# Patient Record
Sex: Female | Born: 1945 | Race: White | Hispanic: No | Smoking: Former smoker
Health system: Southern US, Community
[De-identification: ages and names within clinical notes are randomized; demographics above are authoritative.]

## PROBLEM LIST (undated history)

## (undated) DIAGNOSIS — I809 Phlebitis and thrombophlebitis of unspecified site: Secondary | ICD-10-CM

## (undated) DIAGNOSIS — I10 Essential (primary) hypertension: Secondary | ICD-10-CM

## (undated) DIAGNOSIS — M5432 Sciatica, left side: Secondary | ICD-10-CM

## (undated) DIAGNOSIS — C8590 Non-Hodgkin lymphoma, unspecified, unspecified site: Secondary | ICD-10-CM

## (undated) DIAGNOSIS — T8859XA Other complications of anesthesia, initial encounter: Secondary | ICD-10-CM

## (undated) DIAGNOSIS — M8580 Other specified disorders of bone density and structure, unspecified site: Secondary | ICD-10-CM

## (undated) DIAGNOSIS — Z8589 Personal history of malignant neoplasm of other organs and systems: Secondary | ICD-10-CM

## (undated) DIAGNOSIS — N183 Chronic kidney disease, stage 3 unspecified: Secondary | ICD-10-CM

## (undated) DIAGNOSIS — N6009 Solitary cyst of unspecified breast: Secondary | ICD-10-CM

## (undated) DIAGNOSIS — Z8672 Personal history of thrombophlebitis: Secondary | ICD-10-CM

## (undated) DIAGNOSIS — H269 Unspecified cataract: Secondary | ICD-10-CM

## (undated) DIAGNOSIS — E785 Hyperlipidemia, unspecified: Secondary | ICD-10-CM

## (undated) DIAGNOSIS — R011 Cardiac murmur, unspecified: Secondary | ICD-10-CM

## (undated) DIAGNOSIS — J45909 Unspecified asthma, uncomplicated: Secondary | ICD-10-CM

## (undated) DIAGNOSIS — Z85828 Personal history of other malignant neoplasm of skin: Secondary | ICD-10-CM

## (undated) HISTORY — DX: Personal history of other malignant neoplasm of skin: Z85.828

## (undated) HISTORY — DX: Personal history of malignant neoplasm of other organs and systems: Z85.89

## (undated) HISTORY — DX: Sciatica, left side: M54.32

## (undated) HISTORY — DX: Phlebitis and thrombophlebitis of unspecified site: I80.9

## (undated) HISTORY — DX: Solitary cyst of unspecified breast: N60.09

## (undated) HISTORY — DX: Chronic kidney disease, stage 3 unspecified: N18.30

## (undated) HISTORY — DX: Essential (primary) hypertension: I10

## (undated) HISTORY — PX: BREAST CYST EXCISION: SHX579

## (undated) HISTORY — DX: Personal history of thrombophlebitis: Z86.72

## (undated) HISTORY — DX: Other specified disorders of bone density and structure, unspecified site: M85.80

## (undated) HISTORY — DX: Unspecified cataract: H26.9

## (undated) HISTORY — PX: OTHER SURGICAL HISTORY: SHX169

## (undated) HISTORY — DX: Cardiac murmur, unspecified: R01.1

## (undated) HISTORY — DX: Hyperlipidemia, unspecified: E78.5

## (undated) HISTORY — DX: Non-Hodgkin lymphoma, unspecified, unspecified site: C85.90

---

## 1987-07-05 DIAGNOSIS — Z8572 Personal history of non-Hodgkin lymphomas: Secondary | ICD-10-CM | POA: Insufficient documentation

## 1987-07-05 DIAGNOSIS — C8590 Non-Hodgkin lymphoma, unspecified, unspecified site: Secondary | ICD-10-CM

## 1987-07-05 HISTORY — DX: Non-Hodgkin lymphoma, unspecified, unspecified site: C85.90

## 1989-07-04 HISTORY — PX: BONE MARROW TRANSPLANT: SHX200

## 1999-01-13 ENCOUNTER — Encounter: Payer: Self-pay | Admitting: *Deleted

## 1999-01-13 ENCOUNTER — Ambulatory Visit (HOSPITAL_COMMUNITY): Admission: RE | Admit: 1999-01-13 | Discharge: 1999-01-13 | Payer: Self-pay | Admitting: *Deleted

## 2000-01-13 ENCOUNTER — Ambulatory Visit (HOSPITAL_COMMUNITY): Admission: RE | Admit: 2000-01-13 | Discharge: 2000-01-13 | Payer: Self-pay | Admitting: *Deleted

## 2000-01-13 ENCOUNTER — Encounter: Payer: Self-pay | Admitting: *Deleted

## 2001-05-22 ENCOUNTER — Ambulatory Visit (HOSPITAL_COMMUNITY): Admission: RE | Admit: 2001-05-22 | Discharge: 2001-05-22 | Payer: Self-pay | Admitting: Family Medicine

## 2001-05-22 ENCOUNTER — Encounter: Payer: Self-pay | Admitting: Family Medicine

## 2002-06-11 ENCOUNTER — Ambulatory Visit (HOSPITAL_COMMUNITY): Admission: RE | Admit: 2002-06-11 | Discharge: 2002-06-11 | Payer: Self-pay | Admitting: Family Medicine

## 2002-06-11 ENCOUNTER — Encounter: Payer: Self-pay | Admitting: Family Medicine

## 2003-06-16 ENCOUNTER — Other Ambulatory Visit: Admission: RE | Admit: 2003-06-16 | Discharge: 2003-06-16 | Payer: Self-pay | Admitting: Family Medicine

## 2004-06-29 ENCOUNTER — Other Ambulatory Visit: Admission: RE | Admit: 2004-06-29 | Discharge: 2004-06-29 | Payer: Self-pay | Admitting: Family Medicine

## 2005-06-30 ENCOUNTER — Other Ambulatory Visit: Admission: RE | Admit: 2005-06-30 | Discharge: 2005-06-30 | Payer: Self-pay | Admitting: Family Medicine

## 2006-09-20 DIAGNOSIS — E785 Hyperlipidemia, unspecified: Secondary | ICD-10-CM | POA: Insufficient documentation

## 2007-10-26 ENCOUNTER — Other Ambulatory Visit: Admission: RE | Admit: 2007-10-26 | Discharge: 2007-10-26 | Payer: Self-pay | Admitting: Family Medicine

## 2008-10-16 ENCOUNTER — Encounter: Admission: RE | Admit: 2008-10-16 | Discharge: 2008-10-16 | Payer: Self-pay | Admitting: Family Medicine

## 2009-05-19 ENCOUNTER — Encounter (INDEPENDENT_AMBULATORY_CARE_PROVIDER_SITE_OTHER): Payer: Self-pay | Admitting: *Deleted

## 2010-01-08 ENCOUNTER — Other Ambulatory Visit: Admission: RE | Admit: 2010-01-08 | Discharge: 2010-01-08 | Payer: Self-pay | Admitting: Family Medicine

## 2010-01-14 ENCOUNTER — Telehealth: Payer: Self-pay | Admitting: Gastroenterology

## 2010-06-17 ENCOUNTER — Encounter
Admission: RE | Admit: 2010-06-17 | Discharge: 2010-06-17 | Payer: Self-pay | Source: Home / Self Care | Attending: Family Medicine | Admitting: Family Medicine

## 2010-08-03 NOTE — Progress Notes (Signed)
Summary: Schedule Colonoscopy  Phone Note Outgoing Call Call back at [facility] Phone [phone]   Call placed by: Harlow Mares CMA Duncan Dull),  January 14, 2010 3:46 PM Call placed to: Patient Summary of Call: patient is due for a colonoscopy, I called the patients number and there is no way to leave a message but i will try to call the patient back again.  Initial call taken by: Harlow Mares CMA [place] Dull),  January 14, 2010 3:49 PM  Follow-up for Phone Call        patients number will not allow [place] to leave a message she is due for a colonoscopy. We will mail her a letter to remind her. Follow-up by: Harlow Mares CMA Duncan Dull),  January 22, 2010 3:58 PM

## 2011-01-20 IMAGING — CT CT ABD-PELV W/O CM
3 of 4 series · 13 of 36 positions shown, 19 images · non-contrast
Comparison: None.

CLINICAL DATA: Recent left flank pain which has resolved

CT ABDOMEN AND PELVIS WITHOUT CONTRAST
TECHNIQUE: Multidetector CT imaging of the abdomen and pelvis was
performed following the standard protocol without intravenous
contrast.

[Series 3: renal stone · axial · 0.73mm/px · z∈[-402,-87]mm · 8 of 82 slices shown, 13 images]
[im 10/82  soft-tissue]
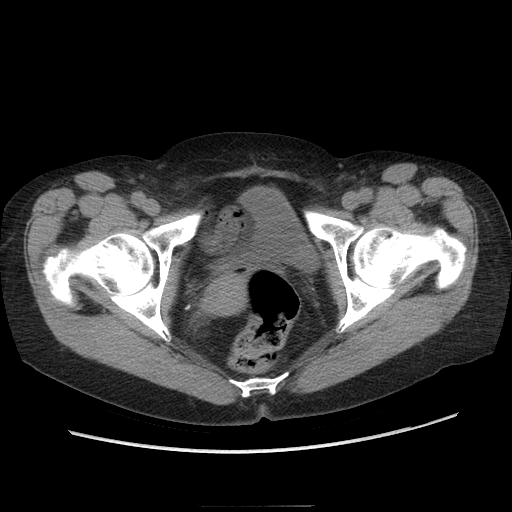
[im 10/82  bone]
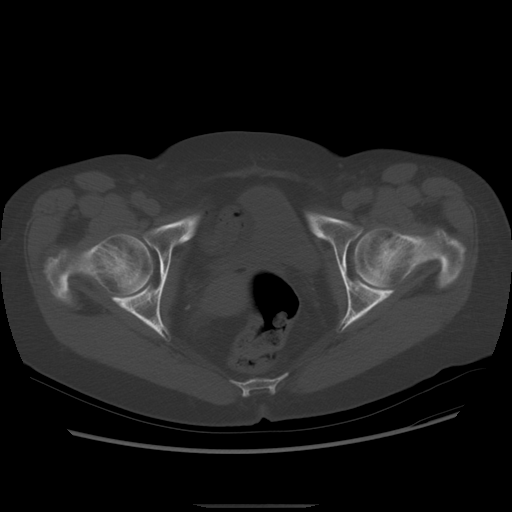
[im 19/82  soft-tissue]
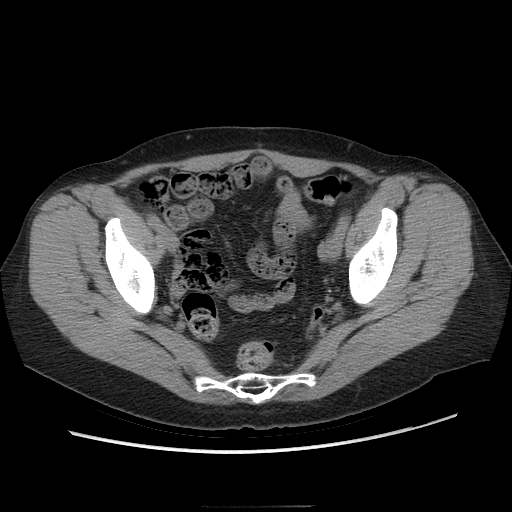
[im 28/82  soft-tissue]
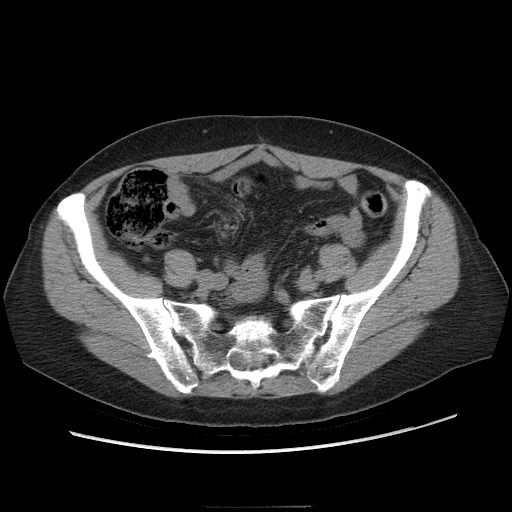
[im 37/82  soft-tissue]
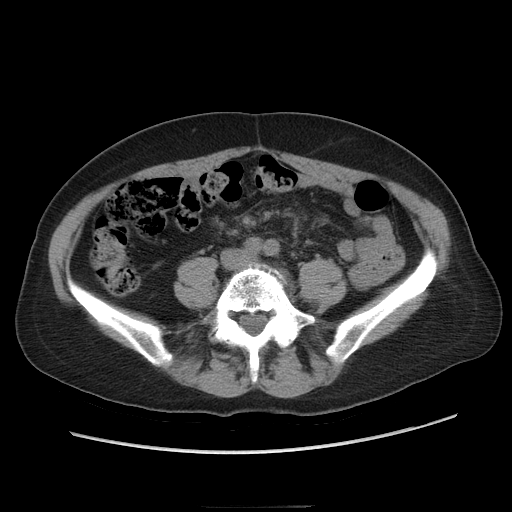
[im 46/82  soft-tissue]
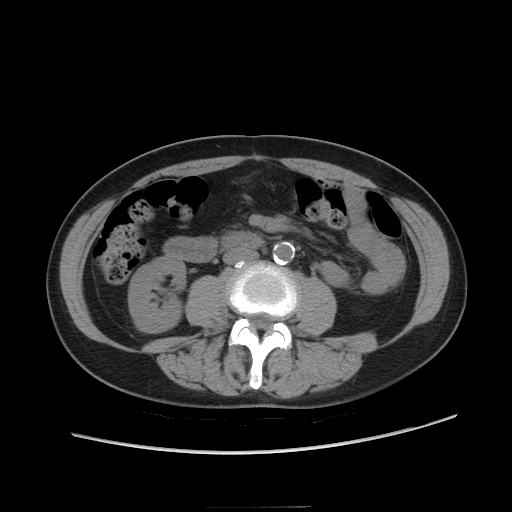
[im 46/82  lung]
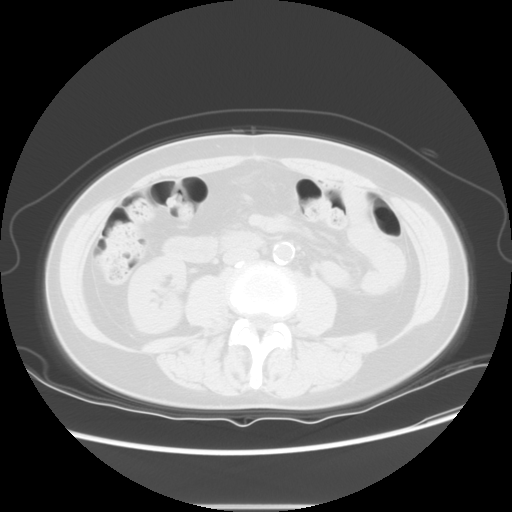
[im 55/82  soft-tissue]
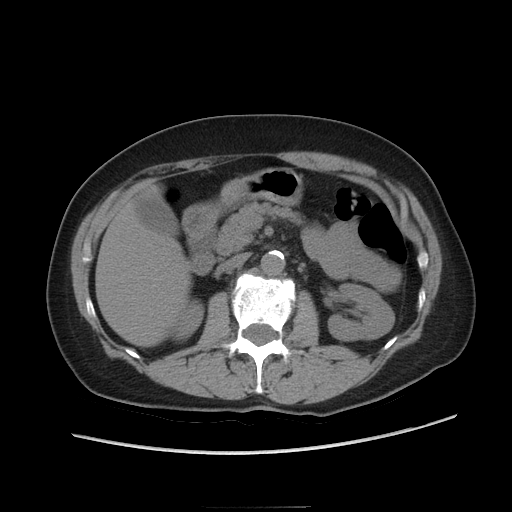
[im 55/82  lung]
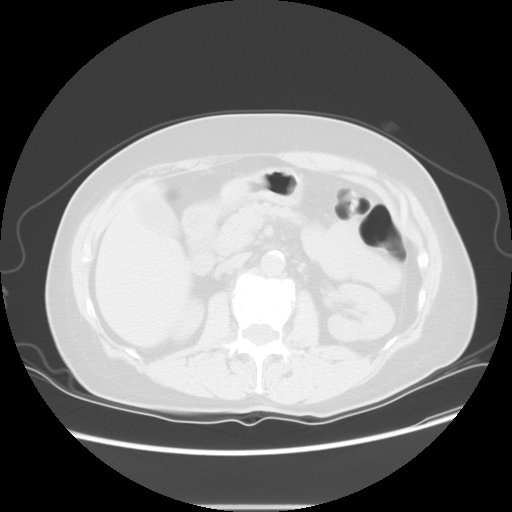
[im 64/82  soft-tissue]
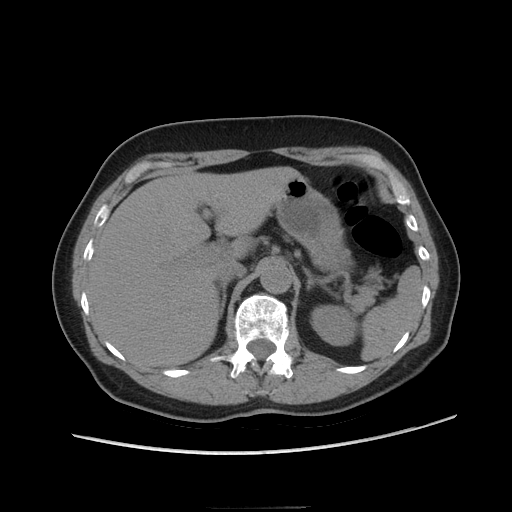
[im 64/82  lung]
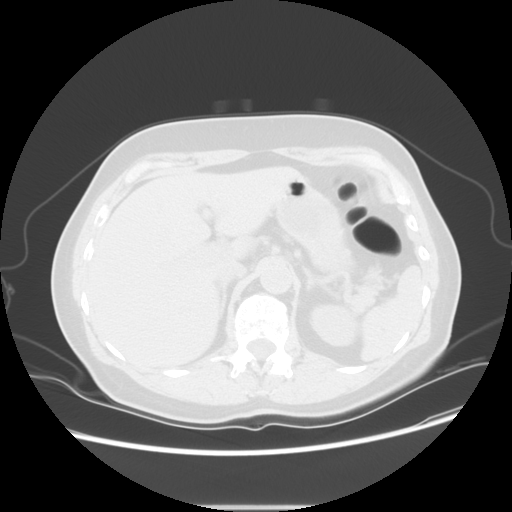
[im 73/82  soft-tissue]
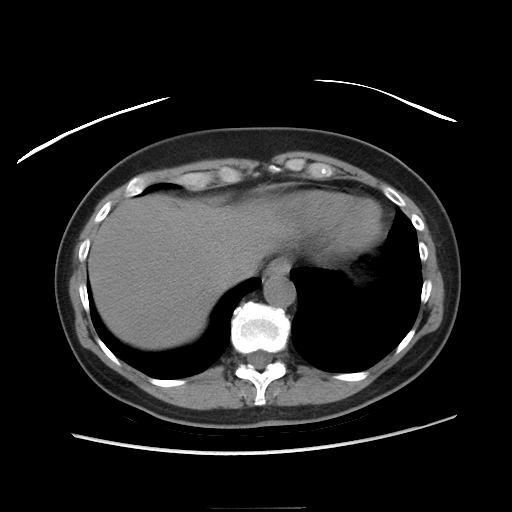
[im 73/82  lung]
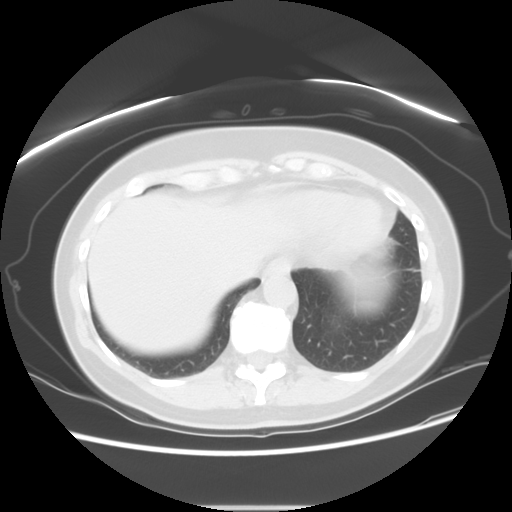

[Series 601: coronal body · coronal · 0.85mm/px · 1 of 97 slices shown, 2 images]
[im 33/97  soft-tissue]
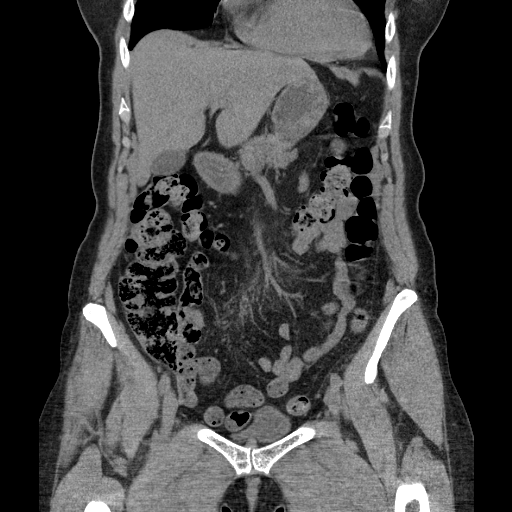
[im 33/97  bone]
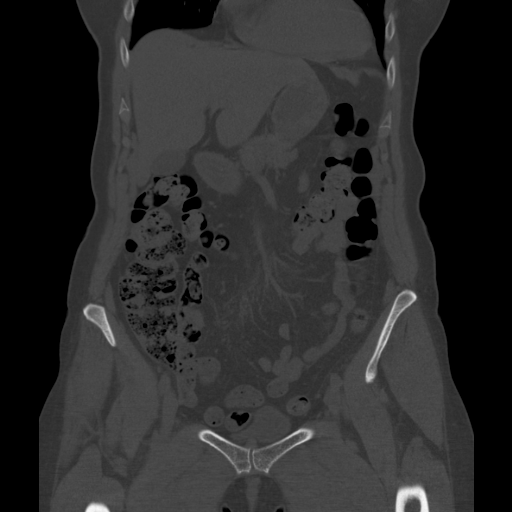

[Series 602: sagittal body · sagittal · 0.85mm/px · 4 of 151 slices shown]
[im 17/151  soft-tissue]
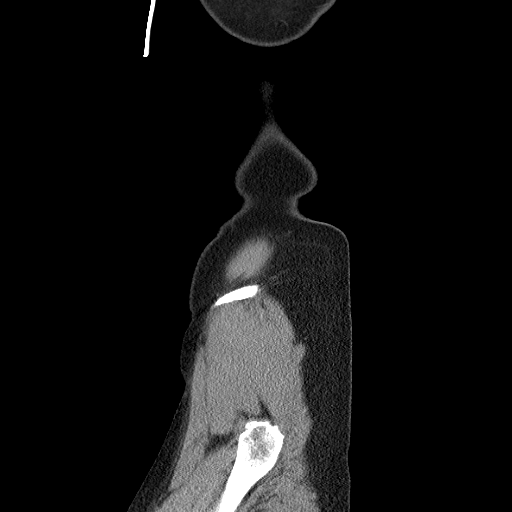
[im 34/151  soft-tissue]
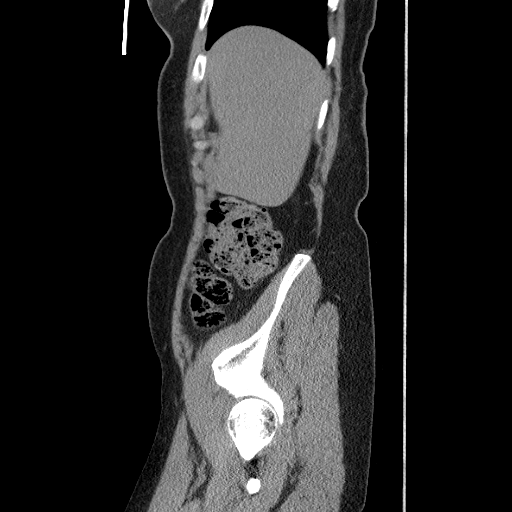
[im 51/151  soft-tissue]
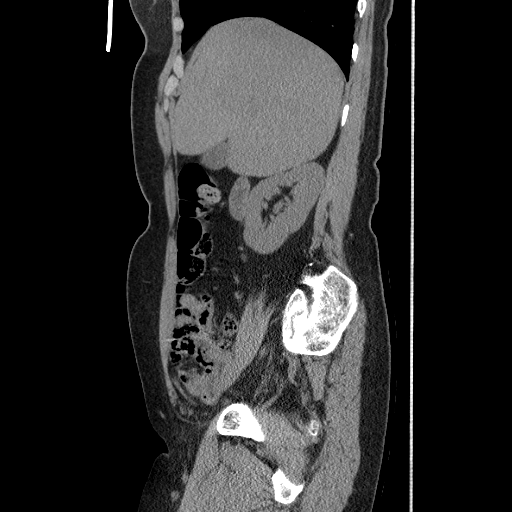
[im 67/151  soft-tissue]
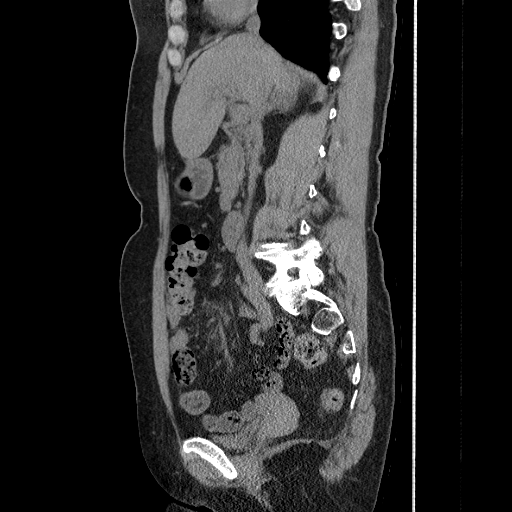

[13 of 36 positions shown; findings below may reference images not displayed]

FINDINGS: The lung bases are clear.  The liver is unremarkable in
the unenhanced state.  No calcified gallstones are seen.  The
pancreas is normal in size and the pancreatic duct is not dilated.
The adrenal glands and spleen are unremarkable.  There are single
small nonobstructing lower pole renal calculi bilaterally of no
more than 2-3 mm in diameter.  The proximal ureters are normal in
caliber.  The abdominal aorta appears normal.

There is some strandiness of the mesenteric fat.  This probably is
due to prior chemotherapy and radiation treatment for non-Hodgkin's
lymphoma years ago.  No adenopathy is currently seen.  The distal
ureters appear normal in caliber and no distal ureteral calculi are
noted.  The urinary bladder is not well distended but is
unremarkable.  The uterus is normal in size.  No adnexal lesion is
seen.  No fluid is noted within the pelvis.  The terminal ileum and
the appendix are unremarkable.  There is degenerative disc disease
at L4-5 and L5 the S1 levels.
IMPRESSION: 1.  Small nonobstructing renal calculi.  No present hydronephrosis.
2.  No ureteral calculi.  The urinary bladder is not well distended
but is unremarkable.
3.  Some strandiness of the soft tissues of the mesentery may be
due to prior radiation and chemotherapy for non-Hodgkin's lymphoma.
No present adenopathy is seen.

## 2011-11-17 ENCOUNTER — Encounter: Payer: Self-pay | Admitting: Gastroenterology

## 2014-01-31 LAB — VITAMIN B12: VITAMIN B 12: 388

## 2015-01-29 DIAGNOSIS — Z1231 Encounter for screening mammogram for malignant neoplasm of breast: Secondary | ICD-10-CM | POA: Diagnosis not present

## 2015-02-09 DIAGNOSIS — Z0000 Encounter for general adult medical examination without abnormal findings: Secondary | ICD-10-CM | POA: Diagnosis not present

## 2015-02-09 DIAGNOSIS — E780 Pure hypercholesterolemia: Secondary | ICD-10-CM | POA: Diagnosis not present

## 2015-02-09 DIAGNOSIS — Z8572 Personal history of non-Hodgkin lymphomas: Secondary | ICD-10-CM | POA: Diagnosis not present

## 2015-02-09 DIAGNOSIS — N183 Chronic kidney disease, stage 3 (moderate): Secondary | ICD-10-CM | POA: Diagnosis not present

## 2015-02-12 DIAGNOSIS — L821 Other seborrheic keratosis: Secondary | ICD-10-CM | POA: Diagnosis not present

## 2015-02-12 DIAGNOSIS — C44722 Squamous cell carcinoma of skin of right lower limb, including hip: Secondary | ICD-10-CM | POA: Diagnosis not present

## 2015-02-12 DIAGNOSIS — C44612 Basal cell carcinoma of skin of right upper limb, including shoulder: Secondary | ICD-10-CM | POA: Diagnosis not present

## 2015-02-12 DIAGNOSIS — Z85828 Personal history of other malignant neoplasm of skin: Secondary | ICD-10-CM | POA: Diagnosis not present

## 2015-02-12 DIAGNOSIS — D485 Neoplasm of uncertain behavior of skin: Secondary | ICD-10-CM | POA: Diagnosis not present

## 2015-02-12 DIAGNOSIS — L720 Epidermal cyst: Secondary | ICD-10-CM | POA: Diagnosis not present

## 2015-02-12 DIAGNOSIS — D239 Other benign neoplasm of skin, unspecified: Secondary | ICD-10-CM | POA: Diagnosis not present

## 2015-02-12 DIAGNOSIS — C44519 Basal cell carcinoma of skin of other part of trunk: Secondary | ICD-10-CM | POA: Diagnosis not present

## 2015-03-19 DIAGNOSIS — C44722 Squamous cell carcinoma of skin of right lower limb, including hip: Secondary | ICD-10-CM | POA: Diagnosis not present

## 2015-03-19 DIAGNOSIS — L905 Scar conditions and fibrosis of skin: Secondary | ICD-10-CM | POA: Diagnosis not present

## 2015-04-02 DIAGNOSIS — C44612 Basal cell carcinoma of skin of right upper limb, including shoulder: Secondary | ICD-10-CM | POA: Diagnosis not present

## 2015-04-02 DIAGNOSIS — C44519 Basal cell carcinoma of skin of other part of trunk: Secondary | ICD-10-CM | POA: Diagnosis not present

## 2015-04-17 DIAGNOSIS — H2513 Age-related nuclear cataract, bilateral: Secondary | ICD-10-CM | POA: Diagnosis not present

## 2015-05-14 DIAGNOSIS — N183 Chronic kidney disease, stage 3 (moderate): Secondary | ICD-10-CM | POA: Diagnosis not present

## 2016-01-20 DIAGNOSIS — Z1231 Encounter for screening mammogram for malignant neoplasm of breast: Secondary | ICD-10-CM | POA: Diagnosis not present

## 2016-01-20 LAB — HM MAMMOGRAPHY

## 2016-02-18 DIAGNOSIS — Z5181 Encounter for therapeutic drug level monitoring: Secondary | ICD-10-CM | POA: Diagnosis not present

## 2016-02-18 DIAGNOSIS — Z0000 Encounter for general adult medical examination without abnormal findings: Secondary | ICD-10-CM | POA: Diagnosis not present

## 2016-02-18 DIAGNOSIS — Z8572 Personal history of non-Hodgkin lymphomas: Secondary | ICD-10-CM | POA: Diagnosis not present

## 2016-02-18 DIAGNOSIS — N183 Chronic kidney disease, stage 3 (moderate): Secondary | ICD-10-CM | POA: Diagnosis not present

## 2016-02-18 DIAGNOSIS — M5430 Sciatica, unspecified side: Secondary | ICD-10-CM | POA: Diagnosis not present

## 2016-02-18 DIAGNOSIS — E7800 Pure hypercholesterolemia, unspecified: Secondary | ICD-10-CM | POA: Diagnosis not present

## 2016-02-18 LAB — BASIC METABOLIC PANEL
BUN: 23 mg/dL — AB (ref 4–21)
CREATININE: 1 mg/dL (ref <=1.1)
GLUCOSE: 101 mg/dL
POTASSIUM: 4.2 mmol/L (ref 3.4–5.3)
Sodium: 141 mmol/L (ref 137–147)

## 2016-02-18 LAB — LIPID PANEL
CHOLESTEROL: 168 mg/dL (ref 0–200)
HDL: 61 mg/dL (ref 35–70)
LDL Cholesterol: 87 mg/dL
Triglycerides: 99 mg/dL (ref 40–160)

## 2016-02-18 LAB — HEPATIC FUNCTION PANEL
ALT: 31 U/L (ref 7–35)
AST: 26 U/L (ref 13–35)

## 2016-02-25 DIAGNOSIS — D485 Neoplasm of uncertain behavior of skin: Secondary | ICD-10-CM | POA: Diagnosis not present

## 2016-02-25 DIAGNOSIS — Z85828 Personal history of other malignant neoplasm of skin: Secondary | ICD-10-CM | POA: Diagnosis not present

## 2016-02-25 DIAGNOSIS — D225 Melanocytic nevi of trunk: Secondary | ICD-10-CM | POA: Diagnosis not present

## 2016-02-25 DIAGNOSIS — L821 Other seborrheic keratosis: Secondary | ICD-10-CM | POA: Diagnosis not present

## 2016-02-26 DIAGNOSIS — C44519 Basal cell carcinoma of skin of other part of trunk: Secondary | ICD-10-CM | POA: Diagnosis not present

## 2016-03-31 DIAGNOSIS — C44519 Basal cell carcinoma of skin of other part of trunk: Secondary | ICD-10-CM | POA: Diagnosis not present

## 2016-03-31 DIAGNOSIS — Z23 Encounter for immunization: Secondary | ICD-10-CM | POA: Diagnosis not present

## 2016-05-09 DIAGNOSIS — D485 Neoplasm of uncertain behavior of skin: Secondary | ICD-10-CM | POA: Diagnosis not present

## 2016-05-11 DIAGNOSIS — C44519 Basal cell carcinoma of skin of other part of trunk: Secondary | ICD-10-CM | POA: Diagnosis not present

## 2016-05-11 DIAGNOSIS — C44719 Basal cell carcinoma of skin of left lower limb, including hip: Secondary | ICD-10-CM | POA: Diagnosis not present

## 2016-06-09 DIAGNOSIS — Z23 Encounter for immunization: Secondary | ICD-10-CM | POA: Diagnosis not present

## 2016-06-09 DIAGNOSIS — C44719 Basal cell carcinoma of skin of left lower limb, including hip: Secondary | ICD-10-CM | POA: Diagnosis not present

## 2016-06-09 DIAGNOSIS — C44519 Basal cell carcinoma of skin of other part of trunk: Secondary | ICD-10-CM | POA: Diagnosis not present

## 2016-09-07 DIAGNOSIS — L821 Other seborrheic keratosis: Secondary | ICD-10-CM | POA: Diagnosis not present

## 2016-09-07 DIAGNOSIS — L918 Other hypertrophic disorders of the skin: Secondary | ICD-10-CM | POA: Diagnosis not present

## 2016-09-07 DIAGNOSIS — L570 Actinic keratosis: Secondary | ICD-10-CM | POA: Diagnosis not present

## 2016-09-16 ENCOUNTER — Telehealth: Payer: Self-pay | Admitting: *Deleted

## 2016-09-16 ENCOUNTER — Encounter: Payer: Self-pay | Admitting: *Deleted

## 2016-09-16 NOTE — Telephone Encounter (Signed)
PreVisit Call Completed. Pt brought in New Patient Packet and will bring in medication list tomorrow with all multivitamins and supplements. Pt states she was a patient of [facility] and is due for her AWV in August.

## 2016-09-19 ENCOUNTER — Ambulatory Visit (INDEPENDENT_AMBULATORY_CARE_PROVIDER_SITE_OTHER): Payer: Medicare Other | Admitting: Family Medicine

## 2016-09-19 ENCOUNTER — Encounter: Payer: Self-pay | Admitting: Family Medicine

## 2016-09-19 VITALS — BP 140/80 | HR 68 | Temp 98.2°F | Ht 65.25 in | Wt 158.0 lb

## 2016-09-19 DIAGNOSIS — M5432 Sciatica, left side: Secondary | ICD-10-CM | POA: Insufficient documentation

## 2016-09-19 DIAGNOSIS — R011 Cardiac murmur, unspecified: Secondary | ICD-10-CM | POA: Insufficient documentation

## 2016-09-19 DIAGNOSIS — Z0000 Encounter for general adult medical examination without abnormal findings: Secondary | ICD-10-CM | POA: Diagnosis not present

## 2016-09-19 DIAGNOSIS — E785 Hyperlipidemia, unspecified: Secondary | ICD-10-CM

## 2016-09-19 MED ORDER — SIMVASTATIN 40 MG PO TABS: ORAL_TABLET | ORAL | 3 refills | Status: DC

## 2016-09-19 NOTE — Progress Notes (Signed)
Pre visit review using our clinic review tool, if applicable. No additional management support is needed unless otherwise documented below in the visit note. 

## 2016-09-19 NOTE — Progress Notes (Signed)
Sierra Summers is a 71 y.o. female is here to [facility].   History of Present Illness:   HPI:   Sierra Summers is a 71 y.o. female who presents for evaluation of dyslipidemia. The patient does not use medications that may worsen dyslipidemias (corticosteroids, progestins, anabolic steroids, diuretics, beta-blockers, amiodarone, cyclosporine, olanzapine). Exercise: daily - yoga, pickle ball. Previous history of cardiac disease includes: None. Cardiovascular ROS: no chest pain or dyspnea on exertion.  Health Maintenance Due  Topic Date Due  . Hepatitis C Screening  Mar 24, 1946  . TETANUS/TDAP  08/24/1964  . MAMMOGRAM  08/25/1995  . DEXA SCAN  08/24/2010  . PNA vac Low Risk Adult (1 of 2 - PCV13) 08/24/2010  . COLONOSCOPY  06/21/2012  . INFLUENZA VACCINE  02/02/2016   Will need to request records from [facility].  PMHx, SurgHx, SocialHx, Medications, and Allergies were reviewed in the Visit Navigator and updated as appropriate.   Past Medical History:  Diagnosis Date  . Breast cyst   . Cataract   . Heart murmur   . Hyperlipidemia   . Non Hodgkin's lymphoma ([place]) 1989    Past Surgical History:  Procedure Laterality Date  . BREAST CYST EXCISION Right   . Laprascopy  1989 and 1992    Family History  Problem Relation Age of Onset  . Heart disease Mother   . Hyperlipidemia Mother   . Hypertension Mother   . Stroke Mother   . Diabetes Father   . Heart disease Father   . Hyperlipidemia Father   . Mental illness Father   . Heart disease Sister   . Hyperlipidemia Sister   . Hypertension Sister   . Stroke Sister   . Hyperlipidemia Sister   . Hypertension Sister   . Mental illness Sister   . Cancer Maternal Grandmother   . Heart disease Maternal Grandfather   . Mental illness Paternal Grandmother   . Mental illness Paternal Grandfather     Social History  Substance Use Topics  . Smoking status: Former Smoker    Quit date: 07/05/1983  . Smokeless  tobacco: Never Used  . Alcohol use Yes     Comment: 4-5 drinks/week    Current Medications and Allergies:    Current Outpatient Prescriptions:  .  aspirin EC 81 MG tablet, Take 81 mg by mouth daily., Disp: , Rfl:  .  Coenzyme Q10 (CO Q 10) 100 MG CAPS, Take 1 capsule by mouth daily., Disp: , Rfl:  .  Multiple Vitamin (MULTIVITAMIN) tablet, Take 1 tablet by mouth daily., Disp: , Rfl:  .  Omega-3 Fatty Acids (OMEGA 3 PO), Take 1,280 mg by mouth daily., Disp: , Rfl:  .  OVER THE COUNTER MEDICATION, Take 1 capsule by mouth daily. Cordyceps, Disp: , Rfl:  .  OVER THE COUNTER MEDICATION, Take 750 mg by mouth daily. Bacopa, Disp: , Rfl:  .  OVER THE COUNTER MEDICATION, Take 1 capsule by mouth at bedtime as needed. Ashwagandha Root 350 mg, Disp: , Rfl:  .  simvastatin (ZOCOR) 20 MG tablet, Take 20 mg by mouth daily., Disp: , Rfl:   Allergies  Allergen Reactions  . Sulfa Antibiotics Rash     Review of Systems:   Review of Systems  Constitutional: Negative for chills, fever, malaise/fatigue and weight loss.  HENT: Negative for hearing loss.   Eyes: Negative for blurred vision.  Respiratory: Negative for cough and wheezing.   Cardiovascular: Negative for chest pain, palpitations and leg swelling.  Gastrointestinal:  Negative for abdominal pain, constipation, diarrhea, heartburn, nausea and vomiting.  Genitourinary: Negative for dysuria.  Musculoskeletal: Negative for falls and myalgias.  Skin: Negative for rash.  Neurological: Negative for dizziness, weakness and headaches.  Endo/Heme/Allergies: Negative for polydipsia.  Psychiatric/Behavioral: Negative for depression. The patient is not nervous/anxious.     Vitals:   Vitals:   09/19/16 0807  BP: 140/80  Pulse: 68  Temp: 98.2 F (36.8 C)  TempSrc: Oral  SpO2: 97%  Weight: 158 lb (71.7 kg)  Height: 5' 5.25" (1.657 m)     Body mass index is 26.09 kg/m.   Physical Exam:   Physical Exam  Constitutional: She is oriented to  person, place, and time. She appears well-developed and well-nourished. No distress.  HENT:  Head: Normocephalic and atraumatic.  Eyes: Conjunctivae and EOM are normal. Pupils are equal, round, and reactive to light.  Neck: Neck supple. No JVD present. No thyromegaly present.  Cardiovascular: Normal rate, regular rhythm, normal heart sounds and intact distal pulses.   Pulmonary/Chest: Effort normal and breath sounds normal.  Abdominal: Soft. Bowel sounds are normal.  Musculoskeletal: Normal range of motion.  Neurological: She is alert and oriented to person, place, and time.  Skin: Skin is warm and dry. Capillary refill takes less than 2 seconds.  Psychiatric: She has a normal mood and affect. Her behavior is normal. Judgment and thought content normal.     Assessment and Plan:   Sierra Summers was seen today for establish care and hyperlipidemia.  Diagnoses and all orders for this visit:  Healthcare maintenance Comments: Interested in DNR discussion. Will set up with Georga Hacking, RN.  Hyperlipidemia, unspecified hyperlipidemia type Comments: Refill today. Recheck lipid at CPE in July. Orders: -     simvastatin (ZOCOR) 40 MG tablet; 1/2 po q hs   . Reviewed expectations re: course of current medical issues. . Discussed self-management of symptoms. . Outlined signs and symptoms indicating need for more acute intervention. . Patient verbalized understanding and all questions were answered. . See orders for this visit as documented in the electronic medical record. . Patient received an After Visit Summary.  Records requested if needed. I spent 30 minutes with this patient, greater than 50% was face-to-face time counseling regarding the above diagnoses.  Briscoe Deutscher, [place], [facility] 09/19/2016   Follow-up: July for CPE  Meds ordered this encounter  Medications  . aspirin EC 81 MG tablet    Sig: Take 81 mg by mouth daily.  . Multiple Vitamin  (MULTIVITAMIN) tablet    Sig: Take 1 tablet by mouth daily.  . Coenzyme Q10 (CO Q 10) 100 MG CAPS    Sig: Take 1 capsule by mouth daily.  . Omega-3 Fatty Acids (OMEGA 3 PO)    Sig: Take 1,280 mg by mouth daily.  Marland Kitchen OVER THE COUNTER MEDICATION    Sig: Take 1 capsule by mouth daily. Cordyceps  . OVER THE COUNTER MEDICATION    Sig: Take 750 mg by mouth daily. Bacopa  . OVER THE COUNTER MEDICATION    Sig: Take 1 capsule by mouth at bedtime as needed. Ashwagandha Root 350 mg   There are no discontinued medications. No orders of the defined types were placed in this encounter.

## 2016-09-22 ENCOUNTER — Encounter: Payer: Self-pay | Admitting: *Deleted

## 2016-09-22 NOTE — Telephone Encounter (Signed)
This encounter was created in error - please disregard.

## 2016-09-28 ENCOUNTER — Encounter: Payer: Self-pay | Admitting: Family Medicine

## 2016-09-28 DIAGNOSIS — Z8589 Personal history of malignant neoplasm of other organs and systems: Secondary | ICD-10-CM | POA: Insufficient documentation

## 2016-09-28 DIAGNOSIS — Z8672 Personal history of thrombophlebitis: Secondary | ICD-10-CM | POA: Insufficient documentation

## 2016-09-28 DIAGNOSIS — Z85828 Personal history of other malignant neoplasm of skin: Secondary | ICD-10-CM

## 2016-09-28 DIAGNOSIS — Z9481 Bone marrow transplant status: Secondary | ICD-10-CM | POA: Insufficient documentation

## 2016-09-28 DIAGNOSIS — N183 Chronic kidney disease, stage 3 unspecified: Secondary | ICD-10-CM | POA: Insufficient documentation

## 2016-09-28 HISTORY — DX: Personal history of other malignant neoplasm of skin: Z85.828

## 2016-09-28 HISTORY — DX: Personal history of thrombophlebitis: Z86.72

## 2016-09-28 HISTORY — DX: Personal history of malignant neoplasm of other organs and systems: Z85.89

## 2016-09-29 ENCOUNTER — Ambulatory Visit (INDEPENDENT_AMBULATORY_CARE_PROVIDER_SITE_OTHER): Payer: Medicare Other | Admitting: *Deleted

## 2016-09-29 DIAGNOSIS — Z66 Do not resuscitate: Secondary | ICD-10-CM | POA: Diagnosis not present

## 2016-09-29 NOTE — Progress Notes (Signed)
Spent 28 minutes discussing the following issues regarding Advance Directives:   -Goals of care  -Code status: difference between a full code, limited code (MOST form), and DNR -Difference between hospice and palliative care -Living Will  -Reason and significance for power of attorney -Reviewed each page in Advance Directive packet and answered any questions by patient  Patient was alert and oriented x4 and appeared to have no cognitive impairments during visit.   Patient requested code status change to DNR.  Provider present for discussion of status change and ensured patient understanding of Do Not Resuscitate orders. Provider signed DNR form and DNR form was scanned into chart.   Patient left visit with Advance Directive packet and plans to return completed on Monday. Patient left visit with original DNR form in hand.

## 2016-09-29 NOTE — Progress Notes (Signed)
RN advanced directive note reviewed. I was present for DNR discussion. Paperwork completed.   Briscoe Deutscher, D.O. [place], [facility]

## 2016-10-04 NOTE — Progress Notes (Signed)
Patient returned to office with completed Healthcare Power of Attorney and Living Will. Reviewed documents, scanned documents into patients chart and gave originals back to patient.

## 2016-11-07 ENCOUNTER — Telehealth: Payer: Self-pay | Admitting: Family Medicine

## 2016-11-07 NOTE — Telephone Encounter (Signed)
Rec'd from [place] forward 17 pages to Dr. Briscoe Deutscher

## 2016-12-12 DIAGNOSIS — H2513 Age-related nuclear cataract, bilateral: Secondary | ICD-10-CM | POA: Diagnosis not present

## 2016-12-21 ENCOUNTER — Telehealth: Payer: Self-pay | Admitting: Family Medicine

## 2016-12-21 NOTE — Telephone Encounter (Signed)
Left pt message asking to call Ebony Hail back directly at [phone] to schedule AWV. Thanks!  *NOTE* Please schedule after 02/01/17

## 2017-01-31 ENCOUNTER — Telehealth: Payer: Self-pay | Admitting: Family Medicine

## 2017-01-31 NOTE — Telephone Encounter (Signed)
Patient has requested a DEXA via mychart. Please advise.

## 2017-02-01 ENCOUNTER — Other Ambulatory Visit: Payer: Self-pay

## 2017-02-01 DIAGNOSIS — E2839 Other primary ovarian failure: Secondary | ICD-10-CM

## 2017-02-01 NOTE — Telephone Encounter (Signed)
Ok to order 

## 2017-02-01 NOTE — Telephone Encounter (Signed)
Absolutely.

## 2017-02-01 NOTE — Telephone Encounter (Signed)
Order placed.  Patient will be contacted to schedule appointment.

## 2017-02-10 NOTE — Telephone Encounter (Signed)
Scheduled 03/20/17

## 2017-02-15 DIAGNOSIS — Z1231 Encounter for screening mammogram for malignant neoplasm of breast: Secondary | ICD-10-CM | POA: Diagnosis not present

## 2017-02-15 LAB — HM MAMMOGRAPHY

## 2017-02-28 DIAGNOSIS — Z85828 Personal history of other malignant neoplasm of skin: Secondary | ICD-10-CM | POA: Diagnosis not present

## 2017-02-28 DIAGNOSIS — L821 Other seborrheic keratosis: Secondary | ICD-10-CM | POA: Diagnosis not present

## 2017-02-28 DIAGNOSIS — D225 Melanocytic nevi of trunk: Secondary | ICD-10-CM | POA: Diagnosis not present

## 2017-02-28 DIAGNOSIS — L570 Actinic keratosis: Secondary | ICD-10-CM | POA: Diagnosis not present

## 2017-02-28 DIAGNOSIS — L720 Epidermal cyst: Secondary | ICD-10-CM | POA: Diagnosis not present

## 2017-03-10 ENCOUNTER — Ambulatory Visit
Admission: RE | Admit: 2017-03-10 | Discharge: 2017-03-10 | Disposition: A | Discharge status: Home / Self Care | Payer: Medicare Other | Source: Ambulatory Visit | Attending: Family Medicine | Admitting: Family Medicine

## 2017-03-10 DIAGNOSIS — E2839 Other primary ovarian failure: Secondary | ICD-10-CM

## 2017-03-10 DIAGNOSIS — M8588 Other specified disorders of bone density and structure, other site: Secondary | ICD-10-CM | POA: Diagnosis not present

## 2017-03-10 DIAGNOSIS — Z780 Asymptomatic menopausal state: Secondary | ICD-10-CM | POA: Diagnosis not present

## 2017-03-13 ENCOUNTER — Encounter: Payer: Self-pay | Admitting: Family Medicine

## 2017-03-13 DIAGNOSIS — M8580 Other specified disorders of bone density and structure, unspecified site: Secondary | ICD-10-CM | POA: Insufficient documentation

## 2017-03-16 NOTE — Progress Notes (Signed)
Pre visit review using our clinic review tool, if applicable. No additional management support is needed unless otherwise documented below in the visit note. 

## 2017-03-16 NOTE — Progress Notes (Signed)
PCP notes:   Health maintenance: Colonoscopy - Cologuard ordered today. Flu - given today.  Abnormal screenings: none   Patient concerns: none   Nurse concerns: none   Next PCP appt: 03/26/2018

## 2017-03-16 NOTE — Progress Notes (Signed)
Subjective:   Sierra Summers is a 71 y.o. female who presents for Medicare Annual (Subsequent) preventive examination.  Review of Systems:  No ROS.  Medicare Wellness Visit. Additional risk factors are reflected in the social history.  Cardiac Risk Factors include: advanced age (>35men, >85 women)     Objective:     Vitals: BP (!) 160/72 (BP Location: Right Arm, Patient Position: Sitting, Cuff Size: Normal)   Pulse 77   Resp 16   Wt 157 lb 9.6 oz (71.5 kg)   SpO2 98%   BMI 26.03 kg/m   Body mass index is 26.03 kg/m.   Tobacco History  Smoking Status  . Former Smoker  . Years: 10.00  . Types: Cigarettes  . Quit date: 07/05/1983  Smokeless Tobacco  . Never Used    Comment: Pt states she was a binge smoker     Counseling given: Not Answered   Past Medical History:  Diagnosis Date  . Breast cyst   . Cataract   . Heart murmur   . Hx of phlebitis 09/28/2016  . 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 Hx of squamous cell carcinoma 09/28/2016  . Hyperlipidemia   . Non Hodgkin's lymphoma ([place]) 1989   Past Surgical History:  Procedure Laterality Date  . BONE MARROW TRANSPLANT  1991  . BREAST CYST EXCISION Right   . Laprascopy  1989 and 1992   Family History  Problem Relation Age of Onset  . Heart disease Mother   . Hyperlipidemia Mother   . Hypertension Mother   . Stroke Mother   . Diabetes Father   . Heart disease Father   . Hyperlipidemia Father   . Mental illness Father   . Heart disease Sister   . Hyperlipidemia Sister   . Hypertension Sister   . Stroke Sister   . Hyperlipidemia Sister   . Hypertension Sister   . Mental illness Sister   . Cancer Maternal Grandmother   . Heart disease Maternal Grandfather   . Mental illness Paternal Grandmother   . Mental illness Paternal Grandfather   . Heart attack Sister   . Stroke Sister   . Dementia Sister   . Parkinson's disease Sister    History  Sexual Activity  . Sexual activity: Not  Currently  . Partners: Male    Outpatient Encounter Prescriptions as of 03/20/2017  Medication Sig  . aspirin EC 81 MG tablet Take 81 mg by mouth as needed.   . Coenzyme Q10 (CO Q 10) 100 MG CAPS Take 1 capsule by mouth daily.  Marland Kitchen MELATONIN PO Take 5 mg by mouth at bedtime.  . Multiple Vitamin (MULTIVITAMIN) tablet Take 1 tablet by mouth daily.  . Omega-3 Fatty Acids (OMEGA 3 PO) Take 1,280 mg by mouth daily.  Marland Kitchen OVER THE COUNTER MEDICATION Take 1 capsule by mouth daily. Cordyceps  . OVER THE COUNTER MEDICATION Take 750 mg by mouth daily. Bacopa  . simvastatin (ZOCOR) 40 MG tablet 1/2 po q hs  . OVER THE COUNTER MEDICATION Take 1 capsule by mouth at bedtime as needed. Ashwagandha Root 350 mg   No facility-administered encounter medications on file as of 03/20/2017.     Activities of Daily Living In your present state of health, do you have any difficulty performing the following activities: 03/20/2017  Hearing? N  Vision? N  Difficulty concentrating or making decisions? N  Walking or climbing stairs? N  Dressing or bathing? N  Doing errands, shopping? N  Preparing Food and eating ? N  Using the Toilet? N  In the past six months, have you accidently leaked urine? N  Do you have problems with loss of bowel control? N  Managing your Medications? N  Managing your Finances? N  Housekeeping or managing your Housekeeping? N  Some recent data might be hidden    Patient Care Team: Briscoe Deutscher, DO as PCP - General (Family Medicine) Otelia Sergeant, OD as Referring Physician Jari Pigg, MD as Consulting Physician (Dermatology)    Assessment:    Physical assessment deferred to PCP.  Exercise Activities and Dietary recommendations Current Exercise Habits: Home exercise routine, Type of exercise: Other - see comments;yoga (Pickle Ball), Time (Minutes): > 60, Frequency (Times/Week): 5, Weekly Exercise (Minutes/Week): 0, Intensity: Moderate, Exercise limited by: None identified  Goals     None     Fall Risk Fall Risk  03/20/2017 09/19/2016  Falls in the past year? No No   Depression Screen PHQ 2/9 Scores 03/20/2017 09/19/2016  PHQ - 2 Score 0 0     Cognitive Function Ad8 score reviewed for issues:  Issues making decisions:0  Less interest in hobbies / activities:0  Repeats questions, stories (family complaining):0  Trouble using ordinary gadgets (microwave, computer, phone):0  Forgets the month or year: 0  Mismanaging finances: 0  Remembering appts:0  Daily problems with thinking and/or memory:0 Ad8 score is=0        Immunization History  Administered Date(s) Administered  . Influenza, High Dose Seasonal PF 03/20/2017  . Pneumococcal Conjugate-13 01/31/2014  . Pneumococcal Polysaccharide-23 01/19/2011  . Tdap 05/02/2007  . Zoster Recombinat (Shingrix) 10/26/2007   Screening Tests Health Maintenance  Topic Date Due  . COLONOSCOPY  09/06/2017 (Originally 06/21/2012)  . INFLUENZA VACCINE  09/29/2017 (Originally 02/01/2017)  . TETANUS/TDAP  05/01/2017  . MAMMOGRAM  02/16/2019  . DEXA SCAN  Completed  . Hepatitis C Screening  Completed  . PNA vac Low Risk Adult  Completed      Plan:   Follow up with PCP as directed.  Pt will check her blood pressure daily and if it remains high then she will call and make an appt next week.  Cologuard order faxed today.  I have personally reviewed and noted the following in the patient's chart:   . Medical and social history . Use of alcohol, tobacco or illicit drugs  . Current medications and supplements . Functional ability and status . Nutritional status . Physical activity . Advanced directives . List of other physicians . Vitals . Screenings to include cognitive, depression, and falls . Referrals and appointments  In addition, I have reviewed and discussed with patient certain preventive protocols, quality metrics, and best practice recommendations. A written personalized care plan for  preventive services as well as general preventive health recommendations were provided to patient.     Ree Edman, RN  03/20/2017

## 2017-03-20 ENCOUNTER — Ambulatory Visit (INDEPENDENT_AMBULATORY_CARE_PROVIDER_SITE_OTHER): Payer: Medicare Other | Admitting: Family Medicine

## 2017-03-20 ENCOUNTER — Encounter: Payer: Self-pay | Admitting: *Deleted

## 2017-03-20 ENCOUNTER — Ambulatory Visit (INDEPENDENT_AMBULATORY_CARE_PROVIDER_SITE_OTHER): Payer: Medicare Other | Admitting: *Deleted

## 2017-03-20 VITALS — BP 160/72 | HR 77 | Resp 16 | Wt 157.6 lb

## 2017-03-20 VITALS — BP 154/92 | HR 77 | Temp 98.2°F | Ht 65.25 in | Wt 157.6 lb

## 2017-03-20 DIAGNOSIS — E7800 Pure hypercholesterolemia, unspecified: Secondary | ICD-10-CM

## 2017-03-20 DIAGNOSIS — Z23 Encounter for immunization: Secondary | ICD-10-CM | POA: Diagnosis not present

## 2017-03-20 DIAGNOSIS — E663 Overweight: Secondary | ICD-10-CM | POA: Diagnosis not present

## 2017-03-20 DIAGNOSIS — Z0000 Encounter for general adult medical examination without abnormal findings: Secondary | ICD-10-CM

## 2017-03-20 LAB — COMPREHENSIVE METABOLIC PANEL
ALT: 35 U/L (ref 0–35)
AST: 35 U/L (ref 0–37)
Albumin: 4.9 g/dL (ref 3.5–5.2)
Alkaline Phosphatase: 77 U/L (ref 39–117)
BUN: 17 mg/dL (ref 6–23)
CO2: 27 mEq/L (ref 19–32)
Calcium: 10.1 mg/dL (ref 8.4–10.5)
Chloride: 101 mEq/L (ref 96–112)
Creatinine, Ser: 1.19 mg/dL (ref 0.40–1.20)
GFR: 47.45 mL/min — ABNORMAL LOW (ref >60.00)
Glucose, Bld: 95 mg/dL (ref 70–99)
Potassium: 4 mEq/L (ref 3.5–5.1)
Sodium: 137 mEq/L (ref 135–145)
Total Bilirubin: 0.7 mg/dL (ref 0.2–1.2)
Total Protein: 7.5 g/dL (ref 6.0–8.3)

## 2017-03-20 LAB — LIPID PANEL
Cholesterol: 179 mg/dL (ref 0–200)
HDL: 81.4 mg/dL (ref >39.00)
LDL Cholesterol: 76 mg/dL (ref 0–99)
NonHDL: 97.1
Total CHOL/HDL Ratio: 2
Triglycerides: 108 mg/dL (ref 0.0–149.0)
VLDL: 21.6 mg/dL (ref 0.0–40.0)

## 2017-03-20 NOTE — Progress Notes (Signed)
Sierra Summers is a 71 y.o. female is here for follow up.  History of Present Illness:   HPI:   Hyperlipidemia Trying to exercise on a regular basis? [x]   YES  []   NO Diet Compliance: compliant most of the time. Concerns: NONE. Cardiovascular ROS: no chest pain or dyspnea on exertion.   Lipids:    Component Value Date/Time   CHOL 179 03/20/2017 1346   TRIG 108.0 03/20/2017 1346   HDL 81.40 03/20/2017 1346   VLDL 21.6 03/20/2017 1346   CHOLHDL 2 03/20/2017 1346   Depression screen PHQ 2/9 03/20/2017 09/19/2016  Decreased Interest 0 0  Down, Depressed, Hopeless 0 0  PHQ - 2 Score 0 0   PMHx, SurgHx, SocialHx, FamHx, Medications, and Allergies were reviewed in the Visit Navigator and updated as appropriate.   Patient Active Problem List   Diagnosis Date Noted  . Osteopenia 03/13/2017  . 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 Hx of phlebitis 09/28/2016  . 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 Hx of squamous cell carcinoma 09/28/2016  . CKD (chronic kidney disease) stage 3, GFR 30-59 ml/min 09/28/2016  . Sciatica, left side 09/19/2016  . Heart murmur   . Hyperlipidemia 09/20/2006  . Hx of non-Hodgkin's lymphoma 07/05/1987   Social History  Substance Use Topics  . Smoking status: Former Smoker    Years: 10.00    Types: Cigarettes    Quit date: 07/05/1983  . Smokeless tobacco: Never Used     Comment: Pt states she was a binge smoker  . Alcohol use Yes     Comment: 3-4 drinks/week   Current Medications and Allergies:   .  aspirin EC 81 MG tablet, Take 81 mg by mouth as needed. , Disp: , Rfl:  .  Coenzyme Q10 (CO Q 10) 100 MG CAPS, Take 1 capsule by mouth daily., Disp: , Rfl:  .  Multiple Vitamin (MULTIVITAMIN) tablet, Take 1 tablet by mouth daily., Disp: , Rfl:  .  Omega-3 Fatty Acids (OMEGA 3 PO), Take 1,280 mg by mouth daily., Disp: , Rfl:  .  OVER THE COUNTER MEDICATION, Take 1 capsule by mouth daily. Cordyceps, Disp: , Rfl:  .  OVER THE  COUNTER MEDICATION, Take 750 mg by mouth daily. Bacopa, Disp: , Rfl:  .  OVER THE COUNTER MEDICATION, Take 1 capsule by mouth at bedtime as needed. Ashwagandha Root 350 mg, Disp: , Rfl:  .  simvastatin (ZOCOR) 40 MG tablet, 1/2 po q hs, Disp: 45 tablet, Rfl: 3 .  MELATONIN PO, Take 5 mg by mouth at bedtime., Disp: , Rfl:    Allergies  Allergen Reactions  . Sulfa Antibiotics Rash   Review of Systems   Pertinent items are noted in the HPI. Otherwise, ROS is negative.  Vitals:   Vitals:   03/20/17 1302  BP: (!) 154/92  Pulse: 77  Temp: 98.2 F (36.8 C)  TempSrc: Oral  SpO2: 98%  Weight: 157 lb 9.6 oz (71.5 kg)  Height: 5' 5.25" (1.657 m)     Body mass index is 26.03 kg/m.   Physical Exam:   Physical Exam  Constitutional: She appears well-nourished.  HENT:  Head: Normocephalic and atraumatic.  Eyes: Pupils are equal, round, and reactive to light. EOM are normal.  Neck: Normal range of motion. Neck supple.  Cardiovascular: Normal rate, regular rhythm, normal heart sounds and intact distal pulses.   Pulmonary/Chest: Effort normal.  Abdominal: Soft.  Skin: Skin is warm.  Psychiatric: She has a normal mood and affect. Her behavior is normal.  Nursing note and vitals reviewed.   Results for orders placed or performed in visit on 03/20/17  Comprehensive metabolic panel  Result Value Ref Range   Sodium 137 135 - 145 mEq/L   Potassium 4.0 3.5 - 5.1 mEq/L   Chloride 101 96 - 112 mEq/L   CO2 27 19 - 32 mEq/L   Glucose, Bld 95 70 - 99 mg/dL   BUN 17 6 - 23 mg/dL   Creatinine, Ser 1.19 0.40 - 1.20 mg/dL   Total Bilirubin 0.7 0.2 - 1.2 mg/dL   Alkaline Phosphatase 77 39 - 117 U/L   AST 35 0 - 37 U/L   ALT 35 0 - 35 U/L   Total Protein 7.5 6.0 - 8.3 g/dL   Albumin 4.9 3.5 - 5.2 g/dL   Calcium 10.1 8.4 - 10.5 mg/dL   GFR 47.45 (L) >60.00 mL/min  Lipid panel  Result Value Ref Range   Cholesterol 179 0 - 200 mg/dL   Triglycerides 108.0 0.0 - 149.0 mg/dL   HDL 81.40  >39.00 mg/dL   VLDL 21.6 0.0 - 40.0 mg/dL   LDL Cholesterol 76 0 - 99 mg/dL   Total CHOL/HDL Ratio 2    NonHDL 97.10    Assessment and Plan:   Sierra Summers was seen today for follow-up.  Diagnoses and all orders for this visit:  Pure hypercholesterolemia -     Comprehensive metabolic panel -     Lipid panel  Overweight (BMI 25.0-29.9) Comments: The patient is asked to make an attempt to improve diet and exercise patterns to aid in medical management of this problem.   . Reviewed expectations re: course of current medical issues. . Discussed self-management of symptoms. . Outlined signs and symptoms indicating need for more acute intervention. . Patient verbalized understanding and all questions were answered. Marland Kitchen Health Maintenance issues including appropriate healthy diet, exercise, and smoking avoidance were discussed with patient. . See orders for this visit as documented in the electronic medical record. . Patient received an After Visit Summary.   Briscoe Deutscher, DO [place] 03/25/2017  Future Appointments Date Time Provider [place]  03/26/2018 1:00 PM Stephanie Acre, RN [facility] None  03/26/2018 2:00 PM Briscoe Deutscher, DO [facility] None

## 2017-03-20 NOTE — Patient Instructions (Addendum)
Sierra Summers , Thank you for taking time to come for your Medicare Wellness Visit. I appreciate your ongoing commitment to your health goals. Please review the following plan we discussed and let me know if I can assist you in the future.   This is a list of the screening recommended for you and due dates:  Health Maintenance  Topic Date Due  . Colon Cancer Screening  09/06/2017*  . Flu Shot  09/29/2017*  . Tetanus Vaccine  05/01/2017  . Mammogram  02/16/2019  . DEXA scan (bone density measurement)  Completed  .  Hepatitis C: One time screening is recommended by Center for Disease Control  (CDC) for  adults born from [birth year] through 1965.   Completed  . Pneumonia vaccines  Completed  *Topic was postponed. The date shown is not the original due date.   Preventive Care for Adults  A healthy lifestyle and preventive care can promote health and wellness. Preventive health guidelines for adults include the following key practices.  . A routine yearly physical is a good way to check with your health care provider about your health and preventive screening. It is a chance to share any concerns and updates on your health and to receive a thorough exam.  . Visit your dentist for a routine exam and preventive care every 6 months. Brush your teeth twice a day and floss once a day. Good oral hygiene prevents tooth decay and gum disease.  . The frequency of eye exams is based on your age, health, family medical history, use  of contact lenses, and other factors. Follow your health care provider's ecommendations for frequency of eye exams.  . Eat a healthy diet. Foods like vegetables, fruits, whole grains, low-fat dairy products, and lean protein foods contain the nutrients you need without too many calories. Decrease your intake of foods high in solid fats, added sugars, and salt. Eat the right amount of calories for you. Get information about a proper diet from your health care provider, if  necessary.  . Regular physical exercise is one of the most important things you can do for your health. Most adults should get at least 150 minutes of moderate-intensity exercise (any activity that increases your heart rate and causes you to sweat) each week. In addition, most adults need muscle-strengthening exercises on 2 or more days a week.  Silver Sneakers may be a benefit available to you. To determine eligibility, you may visit the website: www.silversneakers.com or contact program at 623-457-5664 Mon-Fri between 8AM-8PM.   . Maintain a healthy weight. The body mass index (BMI) is a screening tool to identify possible weight problems. It provides an estimate of body fat based on height and weight. Your health care provider can find your BMI and can help you achieve or maintain a healthy weight.   For adults 20 years and older: ? A BMI below 18.5 is considered underweight. ? A BMI of 18.5 to 24.9 is normal. ? A BMI of 25 to 29.9 is considered overweight. ? A BMI of 30 and above is considered obese.   . Maintain normal blood lipids and cholesterol levels by exercising and minimizing your intake of saturated fat. Eat a balanced diet with plenty of fruit and vegetables. Blood tests for lipids and cholesterol should begin at age 63 and be repeated every 5 years. If your lipid or cholesterol levels are high, you are over 50, or you are at high risk for heart disease, you may need your cholesterol  levels checked more frequently. Ongoing high lipid and cholesterol levels should be treated with medicines if diet and exercise are not working.  . If you smoke, find out from your health care provider how to quit. If you do not use tobacco, please do not start.  . If you choose to drink alcohol, please do not consume more than 2 drinks per day. One drink is considered to be 12 ounces (355 mL) of beer, 5 ounces (148 mL) of wine, or 1.5 ounces (44 mL) of liquor.  . If you are 50-46 years old, ask your  health care provider if you should take aspirin to prevent strokes.  . Use sunscreen. Apply sunscreen liberally and repeatedly throughout the day. You should seek shade when your shadow is shorter than you. Protect yourself by wearing long sleeves, pants, a wide-brimmed hat, and sunglasses year round, whenever you are outdoors.  . Once a month, do a whole body skin exam, using a mirror to look at the skin on your back. Tell your health care provider of new moles, moles that have irregular borders, moles that are larger than a pencil eraser, or moles that have changed in shape or color.

## 2017-03-21 ENCOUNTER — Other Ambulatory Visit: Payer: Self-pay | Admitting: *Deleted

## 2017-03-21 DIAGNOSIS — M5430 Sciatica, unspecified side: Secondary | ICD-10-CM

## 2017-03-21 NOTE — Progress Notes (Signed)
I have personally reviewed the Medicare Annual Wellness questionnaire and have noted 1. The patient's medical and social history 2. Their use of alcohol, tobacco or illicit drugs 3. Their current medications and supplements 4. The patient's functional ability including ADL's, fall risks, home safety risks and hearing or visual impairment. 5. Diet and physical activities 6. Evidence for depression or mood disorders 7. Reviewed Updated provider list, see scanned forms and [facility] Snapshot.   The patients weight, height, BMI and visual acuity have been recorded in the chart I have made referrals, counseling and provided education to the patient based review of the above and I have provided the pt with a written personalized care plan for preventive services.  I have provided the patient with a copy of your personalized plan for preventive services. Instructed to take the time to review along with their updated medication list.   Future [place]ell, D.O. [facility]  

## 2017-03-25 ENCOUNTER — Encounter: Payer: Self-pay | Admitting: Family Medicine

## 2017-04-04 ENCOUNTER — Encounter: Payer: Self-pay | Admitting: Family Medicine

## 2017-04-09 ENCOUNTER — Encounter: Payer: Self-pay | Admitting: Family Medicine

## 2017-04-09 DIAGNOSIS — Z1212 Encounter for screening for malignant neoplasm of rectum: Secondary | ICD-10-CM | POA: Diagnosis not present

## 2017-04-09 DIAGNOSIS — Z1211 Encounter for screening for malignant neoplasm of colon: Secondary | ICD-10-CM | POA: Diagnosis not present

## 2017-04-09 LAB — COLOGUARD

## 2017-04-11 ENCOUNTER — Encounter: Payer: Self-pay | Admitting: Family Medicine

## 2017-04-11 ENCOUNTER — Ambulatory Visit (INDEPENDENT_AMBULATORY_CARE_PROVIDER_SITE_OTHER): Payer: Medicare Other | Admitting: Family Medicine

## 2017-04-11 VITALS — BP 130/82 | HR 81 | Temp 98.6°F | Wt 160.4 lb

## 2017-04-11 DIAGNOSIS — I10 Essential (primary) hypertension: Secondary | ICD-10-CM

## 2017-04-11 MED ORDER — AMLODIPINE BESYLATE 5 MG PO TABS: 5.0000 mg | ORAL_TABLET | Freq: Every day | ORAL | 0 refills | Status: DC

## 2017-04-11 NOTE — Progress Notes (Signed)
Sierra Summers is a 71 y.o. female is here for follow up.  History of Present Illness:   Sierra Summers CMA acting as scribe for Dr. Juleen Summers.  HPI: Patient comes in today to follow up on her hypertension. She stated that her blood pressure has been running high at home. She has been sending My Chart messages to [facility] [place] about this. Blood pressure was good today.   The 10-year ASCVD risk score Sierra Summers Sierra Summers., et al., 2013) is: 13.2%   Values used to calculate the score:     Age: 32 years     Sex: Female     Is Non-Hispanic African American: No     Diabetic: No     Tobacco smoker: No     Systolic Blood Pressure: 841 mmHg     Is BP treated: Yes     HDL Cholesterol: 81.4 mg/dL     Total Cholesterol: 179 mg/dL  There are no preventive care reminders to display for this patient. Depression screen Sierra Summers 2/9 03/20/2017 09/19/2016  Decreased Interest 0 0  Down, Depressed, Hopeless 0 0  PHQ - 2 Score 0 0   PMHx, SurgHx, SocialHx, FamHx, Medications, and Allergies were reviewed in the Visit Navigator and updated as appropriate.   Patient Active Problem List   Diagnosis Date Noted  . Osteopenia 03/13/2017  . 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 Hx of phlebitis 09/28/2016  . 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 Hx of squamous cell carcinoma 09/28/2016  . CKD (chronic kidney disease) stage 3, GFR 30-59 ml/min (HCC) 09/28/2016  . Sciatica, left side 09/19/2016  . Heart murmur   . Hyperlipidemia 09/20/2006  . Hx of non-Hodgkin's lymphoma 07/05/1987   Social History  Substance Use Topics  . Smoking status: Former Smoker    Years: 10.00    Types: Cigarettes    Quit date: 07/05/1983  . Smokeless tobacco: Never Used     Comment: Pt states she was a binge smoker  . Alcohol use Yes     Comment: 3-4 drinks/week   Current Medications and Allergies:   .  aspirin EC 81 MG tablet, Take 81 mg by mouth as needed. , Disp: , Rfl:  .  Coenzyme Q10 (CO Q 10) 100 MG CAPS,  Take 1 capsule by mouth daily., Disp: , Rfl:  .  MELATONIN PO, Take 5 mg by mouth at bedtime., Disp: , Rfl:  .  Multiple Vitamin (MULTIVITAMIN) tablet, Take 1 tablet by mouth daily., Disp: , Rfl:  .  Omega-3 Fatty Acids (OMEGA 3 PO), Take 1,280 mg by mouth daily., Disp: , Rfl:  .  OVER THE COUNTER MEDICATION, Take 1 capsule by mouth daily. Cordyceps, Disp: , Rfl:  .  OVER THE COUNTER MEDICATION, Take 750 mg by mouth daily. Bacopa, Disp: , Rfl:  .  OVER THE COUNTER MEDICATION, Take 1 capsule by mouth at bedtime as needed. Ashwagandha Root 350 mg, Disp: , Rfl:  .  simvastatin (ZOCOR) 40 MG tablet, 1/2 po q hs, Disp: 45 tablet, Rfl: 3   Allergies  Allergen Reactions  . Sulfa Antibiotics Rash   Review of Systems   Pertinent items are noted in the HPI. Otherwise, ROS is negative.  Vitals:   Vitals:   04/11/17 1548  BP: 130/82  Pulse: 81  Temp: 98.6 F (37 C)  TempSrc: Oral  SpO2: 97%  Weight: 160 lb 6.4 oz (72.8 kg)     Body mass index is  26.49 kg/m.   Physical Exam:   Physical Exam  Constitutional: She appears well-nourished.  HENT:  Head: Normocephalic and atraumatic.  Eyes: Pupils are equal, round, and reactive to light. EOM are normal.  Neck: Normal range of motion. Neck supple.  Cardiovascular: Normal rate, regular rhythm, normal heart sounds and intact distal pulses.   Pulmonary/Chest: Effort normal.  Abdominal: Soft.  Skin: Skin is warm.  Psychiatric: She has a normal mood and affect. Her behavior is normal.  Nursing note and vitals reviewed.   Results for orders placed or performed in visit on 03/20/17  Comprehensive metabolic panel  Result Value Ref Range   Sodium 137 135 - 145 mEq/L   Potassium 4.0 3.5 - 5.1 mEq/L   Chloride 101 96 - 112 mEq/L   CO2 27 19 - 32 mEq/L   Glucose, Bld 95 70 - 99 mg/dL   BUN 17 6 - 23 mg/dL   Creatinine, Ser 1.19 0.40 - 1.20 mg/dL   Total Bilirubin 0.7 0.2 - 1.2 mg/dL   Alkaline Phosphatase 77 39 - 117 U/L   AST 35 0 - 37  U/L   ALT 35 0 - 35 U/L   Total Protein 7.5 6.0 - 8.3 g/dL   Albumin 4.9 3.5 - 5.2 g/dL   Calcium 10.1 8.4 - 10.5 mg/dL   GFR 47.45 (L) >60.00 mL/min  Lipid panel  Result Value Ref Range   Cholesterol 179 0 - 200 mg/dL   Triglycerides 108.0 0.0 - 149.0 mg/dL   HDL 81.40 >39.00 mg/dL   VLDL 21.6 0.0 - 40.0 mg/dL   LDL Cholesterol 76 0 - 99 mg/dL   Total CHOL/HDL Ratio 2    NonHDL 97.10    Assessment and Plan:   Sierra Summers was seen today for hypertension.  Diagnoses and all orders for this visit:  Essential hypertension Comments: After discussion, patient would like to start below medication. Expectations, risks, and potential side effects reviewed.  Orders: -     amLODipine (NORVASC) 5 MG tablet; Take 1 tablet (5 mg total) by mouth daily.  . Reviewed expectations re: course of current medical issues. . Discussed self-management of symptoms. . Outlined signs and symptoms indicating need for more acute intervention. . Patient verbalized understanding and all questions were answered. Marland Kitchen Health Maintenance issues including appropriate healthy diet, exercise, and smoking avoidance were discussed with patient. . See orders for this visit as documented in the electronic medical record. . Patient received an After Visit Summary.  CMA served as Education administrator during this visit. History, Physical, and Plan performed by medical provider. The above documentation has been reviewed and is accurate and complete. Sierra Summers, D.O.  Sierra Deutscher, DO [place] 04/16/2017  Future Appointments Date Time Provider [place]  05/09/2017 2:00 PM [facility] NURSE [facility] None  03/26/2018 1:00 PM Sierra Area, RN [facility] None  03/26/2018 2:00 PM Sierra Deutscher, DO [facility] None

## 2017-04-16 DIAGNOSIS — I10 Essential (primary) hypertension: Secondary | ICD-10-CM | POA: Insufficient documentation

## 2017-04-17 LAB — COLOGUARD: Cologuard: NEGATIVE

## 2017-04-18 ENCOUNTER — Encounter: Payer: Self-pay | Admitting: Surgical

## 2017-05-09 ENCOUNTER — Ambulatory Visit (INDEPENDENT_AMBULATORY_CARE_PROVIDER_SITE_OTHER): Payer: Medicare Other | Admitting: Family Medicine

## 2017-05-09 VITALS — BP 134/70 | HR 87 | Temp 97.8°F | Wt 161.0 lb

## 2017-05-09 DIAGNOSIS — R002 Palpitations: Secondary | ICD-10-CM

## 2017-05-09 DIAGNOSIS — I10 Essential (primary) hypertension: Secondary | ICD-10-CM

## 2017-05-09 MED ORDER — LOSARTAN POTASSIUM 50 MG PO TABS: 50.0000 mg | ORAL_TABLET | Freq: Every day | ORAL | 3 refills | Status: DC

## 2017-05-09 NOTE — Patient Instructions (Addendum)
Your EKG looks good.  Stop the Norvasc.  Start the Losartan. It is a great blood pressure medication that also protects the kidney.   Follow up in one month.

## 2017-05-09 NOTE — Progress Notes (Signed)
Sierra Summers is a 71 y.o. female is here for follow up.  History of Present Illness:   HPI:   Hypertension Home blood pressure readings not at goal at home.  Avoiding excessive salt intake? [x]   YES  []   NO Trying to exercise on a regular basis? []   YES  [x]   NO Review: taking medications as instructed, side effects noted by patient include no medication side effects noted and palpitations, no TIAs, no chest pain on exertion, no dyspnea on exertion, no swelling of ankles.   Wt Readings from Last 3 Encounters:  05/09/17 161 lb (73 kg)  04/11/17 160 lb 6.4 oz (72.8 kg)  03/20/17 157 lb 9.6 oz (71.5 kg)    reports that she quit smoking about 33 years ago. Her smoking use included cigarettes. She quit after 10.00 years of use. she has never used smokeless tobacco. BP Readings from Last 3 Encounters:  05/09/17 134/70  04/11/17 130/82  03/20/17 (!) 160/72   Lab Results  Component Value Date   CREATININE 1.19 03/20/2017   Health Maintenance Due  Topic Date Due  . TETANUS/TDAP  05/01/2017   Depression screen PHQ 2/9 03/20/2017 09/19/2016  Decreased Interest 0 0  Down, Depressed, Hopeless 0 0  PHQ - 2 Score 0 0   PMHx, SurgHx, SocialHx, FamHx, Medications, and Allergies were reviewed in the Visit Navigator and updated as appropriate.   Patient Active Problem List   Diagnosis Date Noted  . Essential hypertension 04/16/2017  . Osteopenia 03/13/2017  . 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 Hx of phlebitis 09/28/2016  . 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 Hx of squamous cell carcinoma 09/28/2016  . CKD (chronic kidney disease) stage 3, GFR 30-59 ml/min (HCC) 09/28/2016  . Sciatica, left side 09/19/2016  . Heart murmur   . Hyperlipidemia 09/20/2006  . Hx of non-Hodgkin's lymphoma 07/05/1987   Social History   Tobacco Use  . Smoking status: Former Smoker    Years: 10.00    Types: Cigarettes    Last attempt to quit: 07/05/1983    Years since  quitting: 33.8  . Smokeless tobacco: Never Used  . Tobacco comment: Pt states she was a binge smoker  Substance Use Topics  . Alcohol use: Yes    Comment: 3-4 drinks/week  . Drug use: No   Current Medications and Allergies:   .  aspirin EC 81 MG tablet, Take 81 mg by mouth as needed. , Disp: , Rfl:  .  Coenzyme Q10 (CO Q 10) 100 MG CAPS, Take 1 capsule by mouth daily., Disp: , Rfl:  .  MELATONIN PO, Take 5 mg by mouth at bedtime., Disp: , Rfl:  .  Multiple Vitamin (MULTIVITAMIN) tablet, Take 1 tablet by mouth daily., Disp: , Rfl:  .  Omega-3 Fatty Acids (OMEGA 3 PO), Take 1,280 mg by mouth daily., Disp: , Rfl:  .  OVER THE COUNTER MEDICATION, Take 1 capsule by mouth daily. Cordyceps, Disp: , Rfl:  .  OVER THE COUNTER MEDICATION, Take 750 mg by mouth daily. Bacopa, Disp: , Rfl:  .  OVER THE COUNTER MEDICATION, Take 1 capsule by mouth at bedtime as needed. Ashwagandha Root 350 mg, Disp: , Rfl:  .  simvastatin (ZOCOR) 40 MG tablet, 1/2 po q hs, Disp: 45 tablet, Rfl: 3  Allergies  Allergen Reactions  . Sulfa Antibiotics Rash   Review of Systems   Pertinent items are noted in the HPI. Otherwise, ROS  is negative.  Vitals:   Vitals:   05/09/17 1357  BP: 134/70  Pulse: 87  Temp: 97.8 F (36.6 C)  TempSrc: Oral  SpO2: 97%  Weight: 161 lb (73 kg)     Body mass index is 26.59 kg/m.   Physical Exam:   Physical Exam  Constitutional: She appears well-nourished.  HENT:  Head: Normocephalic and atraumatic.  Eyes: EOM are normal. Pupils are equal, round, and reactive to light.  Neck: Normal range of motion. Neck supple.  Cardiovascular: Normal rate, regular rhythm, normal heart sounds and intact distal pulses.  Pulmonary/Chest: Effort normal.  Abdominal: Soft.  Skin: Skin is warm.  Psychiatric: She has a normal mood and affect. Her behavior is normal.  Nursing note and vitals reviewed.   EKG: normal sinus rhythm.  Assessment and Plan:   Colin was seen today for blood  pressure check.  Diagnoses and all orders for this visit:  Palpitations Comments: EKG today without concerning changes.  Orders: -     EKG 12-Lead  Essential hypertension Comments: Will change Norvasc to Losartan. Recheck in 2-4 weeks. Red flags reviewed. Orders: -     losartan (COZAAR) 50 MG tablet; Take 1 tablet (50 mg total) daily by mouth.   . Reviewed expectations re: course of current medical issues. . Discussed self-management of symptoms. . Outlined signs and symptoms indicating need for more acute intervention. . Patient verbalized understanding and all questions were answered. Marland Kitchen Health Maintenance issues including appropriate healthy diet, exercise, and smoking avoidance were discussed with patient. . See orders for this visit as documented in the electronic medical record. . Patient received an After Visit Summary.   Briscoe Deutscher, DO [place] 05/09/2017  Future Appointments  Date Time Provider [place]  03/26/2018  1:00 PM Williemae Area, RN [facility] None  03/26/2018  2:00 PM Briscoe Deutscher, DO [facility] None

## 2017-06-06 ENCOUNTER — Ambulatory Visit (INDEPENDENT_AMBULATORY_CARE_PROVIDER_SITE_OTHER): Payer: Medicare Other | Admitting: Family Medicine

## 2017-06-06 VITALS — BP 126/72 | HR 83 | Temp 97.9°F | Ht 65.25 in | Wt 160.0 lb

## 2017-06-06 DIAGNOSIS — I10 Essential (primary) hypertension: Secondary | ICD-10-CM

## 2017-06-06 LAB — BASIC METABOLIC PANEL
BUN: 29 mg/dL — ABNORMAL HIGH (ref 6–23)
CO2: 27 mEq/L (ref 19–32)
Calcium: 9.4 mg/dL (ref 8.4–10.5)
Chloride: 101 mEq/L (ref 96–112)
Creatinine, Ser: 1.3 mg/dL — ABNORMAL HIGH (ref 0.40–1.20)
GFR: 42.82 mL/min — ABNORMAL LOW (ref >60.00)
Glucose, Bld: 96 mg/dL (ref 70–99)
Potassium: 4.9 mEq/L (ref 3.5–5.1)
Sodium: 136 mEq/L (ref 135–145)

## 2017-06-06 NOTE — Progress Notes (Signed)
Patient came in today for a blood pressure check. Patient was started on Losartan at last visit. Patient stated that when she first started the medication she was having soma palpations, but that only lasted a few days. She stated that she thinks that it was the residual effect from the previous medication. B Met done to day.

## 2017-06-18 NOTE — Progress Notes (Signed)
Noted and agree. Sierra Summers

## 2017-06-20 ENCOUNTER — Other Ambulatory Visit: Payer: Self-pay

## 2017-06-20 ENCOUNTER — Telehealth: Payer: Self-pay

## 2017-06-20 DIAGNOSIS — I10 Essential (primary) hypertension: Secondary | ICD-10-CM

## 2017-06-20 NOTE — Telephone Encounter (Signed)
Order placed

## 2017-06-20 NOTE — Telephone Encounter (Signed)
Pt coming for repeat labs 06/21/17. Please place future orders. Thank you.

## 2017-06-21 ENCOUNTER — Other Ambulatory Visit: Payer: Medicare Other

## 2017-06-22 ENCOUNTER — Other Ambulatory Visit: Payer: Medicare Other

## 2017-06-23 ENCOUNTER — Other Ambulatory Visit: Payer: Medicare Other

## 2017-06-26 ENCOUNTER — Other Ambulatory Visit (INDEPENDENT_AMBULATORY_CARE_PROVIDER_SITE_OTHER): Payer: Medicare Other

## 2017-06-26 DIAGNOSIS — I10 Essential (primary) hypertension: Secondary | ICD-10-CM

## 2017-06-26 LAB — BASIC METABOLIC PANEL
BUN: 35 mg/dL — ABNORMAL HIGH (ref 6–23)
CO2: 25 mEq/L (ref 19–32)
Calcium: 8.8 mg/dL (ref 8.4–10.5)
Chloride: 107 mEq/L (ref 96–112)
Creatinine, Ser: 1.26 mg/dL — ABNORMAL HIGH (ref 0.40–1.20)
GFR: 44.39 mL/min — ABNORMAL LOW (ref >60.00)
Glucose, Bld: 97 mg/dL (ref 70–99)
Potassium: 4.2 mEq/L (ref 3.5–5.1)
Sodium: 140 mEq/L (ref 135–145)

## 2017-08-14 ENCOUNTER — Ambulatory Visit (INDEPENDENT_AMBULATORY_CARE_PROVIDER_SITE_OTHER): Payer: Medicare Other | Admitting: Family Medicine

## 2017-08-14 ENCOUNTER — Encounter: Payer: Self-pay | Admitting: Family Medicine

## 2017-08-14 VITALS — BP 124/66 | HR 88 | Temp 98.2°F | Ht 65.25 in | Wt 157.4 lb

## 2017-08-14 DIAGNOSIS — N183 Chronic kidney disease, stage 3 unspecified: Secondary | ICD-10-CM

## 2017-08-14 DIAGNOSIS — I10 Essential (primary) hypertension: Secondary | ICD-10-CM

## 2017-08-14 LAB — COMPREHENSIVE METABOLIC PANEL
ALT: 33 U/L (ref 0–35)
AST: 29 U/L (ref 0–37)
Albumin: 4.5 g/dL (ref 3.5–5.2)
Alkaline Phosphatase: 59 U/L (ref 39–117)
BUN: 18 mg/dL (ref 6–23)
CO2: 27 mEq/L (ref 19–32)
Calcium: 9.8 mg/dL (ref 8.4–10.5)
Chloride: 105 mEq/L (ref 96–112)
Creatinine, Ser: 1.26 mg/dL — ABNORMAL HIGH (ref 0.40–1.20)
GFR: 44.37 mL/min — ABNORMAL LOW (ref >60.00)
Glucose, Bld: 112 mg/dL — ABNORMAL HIGH (ref 70–99)
Potassium: 4.6 mEq/L (ref 3.5–5.1)
Sodium: 142 mEq/L (ref 135–145)
Total Bilirubin: 0.6 mg/dL (ref 0.2–1.2)
Total Protein: 7.5 g/dL (ref 6.0–8.3)

## 2017-08-14 NOTE — Progress Notes (Signed)
Sierra Summers is a 72 y.o. female is here for follow up.  History of Present Illness:   Shaune Pascal CMA acting as scribe for Dr. Juleen China.  HPI: Patient comes I today for follow up on blood pressure. She is tolerating blood pressure medication well.   1. Essential hypertension.   Review: taking medications as instructed, no medication side effects noted, no TIAs, no chest pain on exertion, no dyspnea on exertion, no swelling of ankles.   Smoker: No.  Wt Readings from Last 3 Encounters:  06/06/17 160 lb (72.6 kg)  05/09/17 161 lb (73 kg)  04/11/17 160 lb 6.4 oz (72.8 kg)   BP Readings from Last 3 Encounters:  06/06/17 126/72  05/09/17 134/70  04/11/17 130/82   Lab Results  Component Value Date   CREATININE 1.26 (H) 06/26/2017    The 10-year ASCVD risk score Mikey Bussing DC Jr., et al., 2013) is: 12.5%   Values used to calculate the score:     Age: 48 years     Sex: Female     Is Non-Hispanic African American: No     Diabetic: No     Tobacco smoker: No     Systolic Blood Pressure: 858 mmHg     Is BP treated: Yes     HDL Cholesterol: 81.4 mg/dL     Total Cholesterol: 179 mg/dL   2. CKD (chronic kidney disease) stage 3, GFR 30-59 ml/min ([place]).   Lab Results  Component Value Date   CREATININE 1.26 (H) 06/26/2017   CREATININE 1.30 (H) 06/06/2017   CREATININE 1.19 03/20/2017     Health Maintenance Due  Topic Date Due  . TETANUS/TDAP  05/01/2017   Depression screen PHQ 2/9 03/20/2017 09/19/2016  Decreased Interest 0 0  Down, Depressed, Hopeless 0 0  PHQ - 2 Score 0 0   PMHx, SurgHx, SocialHx, FamHx, Medications, and Allergies were reviewed in the Visit Navigator and updated as appropriate.   Patient Active Problem List   Diagnosis Date Noted  . Essential hypertension 04/16/2017  . Osteopenia 03/13/2017  . 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 Hx of phlebitis 09/28/2016  . 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 Hx of squamous cell  carcinoma 09/28/2016  . CKD (chronic kidney disease) stage 3, GFR 30-59 ml/min (HCC) 09/28/2016  . Sciatica, left side 09/19/2016  . Heart murmur   . Hyperlipidemia 09/20/2006  . Hx of non-Hodgkin's lymphoma 07/05/1987   Social History   Tobacco Use  . Smoking status: Former Smoker    Years: 10.00    Types: Cigarettes    Last attempt to quit: 07/05/1983    Years since quitting: 34.1  . Smokeless tobacco: Never Used  . Tobacco comment: Pt states she was a binge smoker  Substance Use Topics  . Alcohol use: Yes    Comment: 3-4 drinks/week  . Drug use: No   Current Medications and Allergies:    .  aspirin EC 81 MG tablet, Take 81 mg by mouth as needed. , Disp: , Rfl:  .  Coenzyme Q10 (CO Q 10) 100 MG CAPS, Take 1 capsule by mouth daily., Disp: , Rfl:  .  losartan (COZAAR) 50 MG tablet, Take 1 tablet (50 mg total) daily by mouth., Disp: 90 tablet, Rfl: 3 .  MELATONIN PO, Take 5 mg by mouth at bedtime., Disp: , Rfl:  .  Multiple Vitamin (MULTIVITAMIN) tablet, Take 1 tablet by mouth daily., Disp: , Rfl:  .  Omega-3 Fatty Acids (OMEGA 3 PO), Take 1,280 mg by mouth daily., Disp: , Rfl:  .  OVER THE COUNTER MEDICATION, Take 1 capsule by mouth daily. Cordyceps, Disp: , Rfl:  .  OVER THE COUNTER MEDICATION, Take 750 mg by mouth daily. Bacopa, Disp: , Rfl:  .  OVER THE COUNTER MEDICATION, Take 1 capsule by mouth at bedtime as needed. Ashwagandha Root 350 mg, Disp: , Rfl:  .  simvastatin (ZOCOR) 40 MG tablet, 1/2 po q hs, Disp: 45 tablet, Rfl: 3  Allergies  Allergen Reactions  . Sulfa Antibiotics Rash   Review of Systems   Pertinent items are noted in the HPI. Otherwise, ROS is negative.  Vitals:  There were no vitals filed for this visit.   There is no height or weight on file to calculate BMI.  Physical Exam:   Physical Exam  Constitutional: She appears well-nourished.  HENT:  Head: Normocephalic and atraumatic.  Eyes: EOM are normal. Pupils are equal, round, and reactive to  light.  Neck: Normal range of motion. Neck supple.  Cardiovascular: Normal rate, regular rhythm, normal heart sounds and intact distal pulses.  Pulmonary/Chest: Effort normal.  Abdominal: Soft.  Skin: Skin is warm.  Psychiatric: She has a normal mood and affect. Her behavior is normal.  Nursing note and vitals reviewed.   Results for orders placed or performed in visit on 28/36/62  Basic metabolic panel  Result Value Ref Range   Sodium 140 135 - 145 mEq/L   Potassium 4.2 3.5 - 5.1 mEq/L   Chloride 107 96 - 112 mEq/L   CO2 25 19 - 32 mEq/L   Glucose, Bld 97 70 - 99 mg/dL   BUN 35 (H) 6 - 23 mg/dL   Creatinine, Ser 1.26 (H) 0.40 - 1.20 mg/dL   Calcium 8.8 8.4 - 10.5 mg/dL   GFR 44.39 (L) >60.00 mL/min   Assessment and Plan:   1. Essential hypertension Patient is doing very well.  Recheck CMP today.  Continue current management.  Recheck in 3-6 months.  - Comprehensive metabolic panel  2. CKD (chronic kidney disease) stage 3, GFR 30-59 ml/min (HCC) Due for CMP checked today.  - Comprehensive metabolic panel   . Reviewed expectations re: course of current medical issues. . Discussed self-management of symptoms. . Outlined signs and symptoms indicating need for more acute intervention. . Patient verbalized understanding and all questions were answered. Marland Kitchen Health Maintenance issues including appropriate healthy diet, exercise, and smoking avoidance were discussed with patient. . See orders for this visit as documented in the electronic medical record. . Patient received an After Visit Summary.  Briscoe Deutscher, DO [place] 08/14/2017

## 2017-10-24 ENCOUNTER — Other Ambulatory Visit: Payer: Self-pay | Admitting: Family Medicine

## 2017-10-24 DIAGNOSIS — E785 Hyperlipidemia, unspecified: Secondary | ICD-10-CM

## 2017-12-20 DIAGNOSIS — H2513 Age-related nuclear cataract, bilateral: Secondary | ICD-10-CM | POA: Diagnosis not present

## 2018-01-30 ENCOUNTER — Encounter: Payer: Self-pay | Admitting: Family Medicine

## 2018-02-05 ENCOUNTER — Encounter: Payer: Self-pay | Admitting: Family Medicine

## 2018-02-11 NOTE — Progress Notes (Signed)
Sierra Summers is a 72 y.o. female is here for follow up.  History of Present Illness:   Sierra Summers, CMA acting as scribe for Dr. Briscoe Deutscher.   HPI: Patient in for follow today. She has noticed in the last ten days that she has had some non productive cough. She has noticed that it increases with caffeine.   Avoiding excessive salt intake? [x]   YES  []   NO Trying to exercise on a regular basis? [x]   YES  []   NO Review: taking medications as instructed, no medication side effects noted, no TIAs, no chest pain on exertion, no dyspnea on exertion, no swelling of ankles.   Smoker: No.  Wt Readings from Last 3 Encounters:  02/12/18 156 lb 6.4 oz (70.9 kg)  08/14/17 157 lb 6.4 oz (71.4 kg)  06/06/17 160 lb (72.6 kg)   BP Readings from Last 3 Encounters:  02/12/18 120/66  08/14/17 124/66  06/06/17 126/72   Lab Results  Component Value Date   CREATININE 1.38 (H) 02/12/2018   Health Maintenance Due  Topic Date Due  . INFLUENZA VACCINE  02/01/2018   Depression screen [facility] 2/9 03/20/2017 09/19/2016  Decreased Interest 0 0  Down, Depressed, Hopeless 0 0  PHQ - 2 Score 0 0   PMHx, SurgHx, SocialHx, FamHx, Medications, and Allergies were reviewed in the Visit Navigator and updated as appropriate.   Patient Active Problem List   Diagnosis Date Noted  . Essential hypertension 04/16/2017  . Osteopenia 03/13/2017  . 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 Hx of phlebitis 09/28/2016  . 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 Hx of squamous cell carcinoma 09/28/2016  . CKD (chronic kidney disease) stage 3, GFR 30-59 ml/min (HCC) 09/28/2016  . Sciatica, left side 09/19/2016  . Heart murmur   . Hyperlipidemia 09/20/2006  . Hx of non-Hodgkin's lymphoma 07/05/1987   Social History   Tobacco Use  . Smoking status: Former Smoker    Years: 10.00    Types: Cigarettes    Last attempt to quit: 07/05/1983    Years since quitting: 34.6  . Smokeless tobacco:  Never Used  . Tobacco comment: Pt states she was a binge smoker  Substance Use Topics  . Alcohol use: Yes    Comment: 3-4 drinks/week  . Drug use: No   Current Medications and Allergies:   .  aspirin EC 81 MG tablet, Take 81 mg by mouth as needed. , Disp: , Rfl:  .  Coenzyme Q10 (CO Q 10) 100 MG CAPS, Take 1 capsule by mouth daily., Disp: , Rfl:  .  losartan (COZAAR) 50 MG tablet, Take 1 tablet (50 mg total) daily by mouth., Disp: 90 tablet, Rfl: 3 .  MELATONIN PO, Take 5 mg by mouth at bedtime., Disp: , Rfl:  .  Multiple Vitamin (MULTIVITAMIN) tablet, Take 1 tablet by mouth daily., Disp: , Rfl:  .  Omega-3 Fatty Acids (OMEGA 3 PO), Take 1,280 mg by mouth daily., Disp: , Rfl:  .  OVER THE COUNTER MEDICATION, Take 1 capsule by mouth daily. Cordyceps, Disp: , Rfl:  .  OVER THE COUNTER MEDICATION, Take 750 mg by mouth daily. Bacopa, Disp: , Rfl:  .  OVER THE COUNTER MEDICATION, Take 1 capsule by mouth at bedtime as needed. Ashwagandha Root 350 mg, Disp: , Rfl:  .  simvastatin (ZOCOR) 40 MG tablet, TAKE HALF TABLET BY MOUTH AT BEDTIME, Disp: 45 tablet, Rfl: 2   Allergies  Allergen Reactions  . Sulfa Antibiotics Rash   Review of Systems   Pertinent items are noted in the HPI. Otherwise, ROS is negative.  Vitals:   Vitals:   02/12/18 0758  BP: 120/66  Pulse: 82  Temp: 99.1 F (37.3 C)  TempSrc: Oral  SpO2: 96%  Weight: 156 lb 6.4 oz (70.9 kg)  Height: 5\' 6"  (1.676 m)     Body mass index is 25.24 kg/m.  Physical Exam:   Physical Exam  Constitutional: She appears well-nourished.  HENT:  Head: Normocephalic and atraumatic.  Eyes: Pupils are equal, round, and reactive to light. EOM are normal.  Neck: Normal range of motion. Neck supple.  Cardiovascular: Normal rate, regular rhythm, normal heart sounds and intact distal pulses.  Pulmonary/Chest: Effort normal.  Abdominal: Soft.  Skin: Skin is warm.  Psychiatric: She has a normal mood and affect. Her behavior is normal.    Nursing note and vitals reviewed.  Assessment and Plan:   Sierra Summers was seen today for follow-up.  Diagnoses and all orders for this visit:  Essential hypertension  CKD (chronic kidney disease) stage 3, GFR 30-59 ml/min (HCC) -     CBC with Differential/Platelet -     Comprehensive metabolic panel  Pure hypercholesterolemia -     Lipid panel  Upper airway cough syndrome   . Reviewed expectations re: course of current medical issues. . Discussed self-management of symptoms. . Outlined signs and symptoms indicating need for more acute intervention. . Patient verbalized understanding and all questions were answered. Marland Kitchen Health Maintenance issues including appropriate healthy diet, exercise, and smoking avoidance were discussed with patient. . See orders for this visit as documented in the electronic medical record. . Patient received an After Visit Summary.  Briscoe Deutscher, DO [place] [place] 02/12/2018  Future Appointments  Date Time Provider [place]  03/28/2018  1:00 PM [facility] HEALTH COACH [facility]  03/28/2018  2:00 PM Briscoe Deutscher, DO [facility]  08/08/2018  8:00 AM Briscoe Deutscher, DO [facility]   CMA served as scribe during this visit. History, Physical, and Plan performed by medical provider. The above documentation has been reviewed and is accurate and complete. Briscoe Deutscher, D.O.

## 2018-02-12 ENCOUNTER — Encounter: Payer: Self-pay | Admitting: Family Medicine

## 2018-02-12 ENCOUNTER — Ambulatory Visit (INDEPENDENT_AMBULATORY_CARE_PROVIDER_SITE_OTHER): Payer: Medicare Other | Admitting: Family Medicine

## 2018-02-12 VITALS — BP 120/66 | HR 82 | Temp 99.1°F | Ht 66.0 in | Wt 156.4 lb

## 2018-02-12 DIAGNOSIS — I10 Essential (primary) hypertension: Secondary | ICD-10-CM

## 2018-02-12 DIAGNOSIS — N183 Chronic kidney disease, stage 3 unspecified: Secondary | ICD-10-CM

## 2018-02-12 DIAGNOSIS — E7800 Pure hypercholesterolemia, unspecified: Secondary | ICD-10-CM

## 2018-02-12 DIAGNOSIS — R058 Other specified cough: Secondary | ICD-10-CM

## 2018-02-12 DIAGNOSIS — R05 Cough: Secondary | ICD-10-CM | POA: Diagnosis not present

## 2018-02-12 LAB — LIPID PANEL
Cholesterol: 156 mg/dL (ref 0–200)
HDL: 61.7 mg/dL (ref >39.00)
LDL Cholesterol: 78 mg/dL (ref 0–99)
NonHDL: 94.62
Total CHOL/HDL Ratio: 3
Triglycerides: 81 mg/dL (ref 0.0–149.0)
VLDL: 16.2 mg/dL (ref 0.0–40.0)

## 2018-02-12 LAB — CBC WITH DIFFERENTIAL/PLATELET
Basophils Absolute: 0 10*3/uL (ref 0.0–0.1)
Basophils Relative: 0.7 % (ref 0.0–3.0)
Eosinophils Absolute: 0.1 10*3/uL (ref 0.0–0.7)
Eosinophils Relative: 1.2 % (ref 0.0–5.0)
HCT: 40.9 % (ref 36.0–46.0)
Hemoglobin: 14.3 g/dL (ref 12.0–15.0)
Lymphocytes Relative: 35.9 % (ref 12.0–46.0)
Lymphs Abs: 1.5 10*3/uL (ref 0.7–4.0)
MCHC: 35 g/dL (ref 30.0–36.0)
MCV: 97.3 fl (ref 78.0–100.0)
Monocytes Absolute: 0.5 10*3/uL (ref 0.1–1.0)
Monocytes Relative: 10.8 % (ref 3.0–12.0)
Neutro Abs: 2.2 10*3/uL (ref 1.4–7.7)
Neutrophils Relative %: 51.4 % (ref 43.0–77.0)
Platelets: 281 10*3/uL (ref 150.0–400.0)
RBC: 4.2 Mil/uL (ref 3.87–5.11)
RDW: 13.1 % (ref 11.5–15.5)
WBC: 4.2 10*3/uL (ref 4.0–10.5)

## 2018-02-12 LAB — COMPREHENSIVE METABOLIC PANEL
ALT: 26 U/L (ref 0–35)
AST: 25 U/L (ref 0–37)
Albumin: 4.6 g/dL (ref 3.5–5.2)
Alkaline Phosphatase: 58 U/L (ref 39–117)
BUN: 30 mg/dL — ABNORMAL HIGH (ref 6–23)
CO2: 27 mEq/L (ref 19–32)
Calcium: 10.3 mg/dL (ref 8.4–10.5)
Chloride: 104 mEq/L (ref 96–112)
Creatinine, Ser: 1.38 mg/dL — ABNORMAL HIGH (ref 0.40–1.20)
GFR: 39.89 mL/min — ABNORMAL LOW (ref >60.00)
Glucose, Bld: 105 mg/dL — ABNORMAL HIGH (ref 70–99)
Potassium: 5.2 mEq/L — ABNORMAL HIGH (ref 3.5–5.1)
Sodium: 140 mEq/L (ref 135–145)
Total Bilirubin: 0.7 mg/dL (ref 0.2–1.2)
Total Protein: 7.6 g/dL (ref 6.0–8.3)

## 2018-02-19 ENCOUNTER — Telehealth: Payer: Self-pay | Admitting: Family Medicine

## 2018-02-19 ENCOUNTER — Other Ambulatory Visit: Payer: Self-pay

## 2018-02-19 DIAGNOSIS — N183 Chronic kidney disease, stage 3 unspecified: Secondary | ICD-10-CM

## 2018-02-19 NOTE — Telephone Encounter (Signed)
See note.   Copied from [place] [id number]. Topic: General - Other >> Feb 19, 2018 10:39 AM Alfredia Ferguson R wrote: Pt called in to scheduled labs due to potassium being elevated but no orders are placed for pt to proceed with labs

## 2018-02-19 NOTE — Telephone Encounter (Signed)
Called patient lab order placed and app made.

## 2018-02-27 ENCOUNTER — Other Ambulatory Visit (INDEPENDENT_AMBULATORY_CARE_PROVIDER_SITE_OTHER): Payer: Medicare Other

## 2018-02-27 DIAGNOSIS — N183 Chronic kidney disease, stage 3 unspecified: Secondary | ICD-10-CM

## 2018-02-27 LAB — BASIC METABOLIC PANEL
BUN: 31 mg/dL — ABNORMAL HIGH (ref 6–23)
CO2: 26 mEq/L (ref 19–32)
Calcium: 9.9 mg/dL (ref 8.4–10.5)
Chloride: 104 mEq/L (ref 96–112)
Creatinine, Ser: 1.44 mg/dL — ABNORMAL HIGH (ref 0.40–1.20)
GFR: 37.98 mL/min — ABNORMAL LOW (ref >60.00)
Glucose, Bld: 105 mg/dL — ABNORMAL HIGH (ref 70–99)
Potassium: 4.4 mEq/L (ref 3.5–5.1)
Sodium: 138 mEq/L (ref 135–145)

## 2018-03-01 DIAGNOSIS — D485 Neoplasm of uncertain behavior of skin: Secondary | ICD-10-CM | POA: Diagnosis not present

## 2018-03-01 DIAGNOSIS — L821 Other seborrheic keratosis: Secondary | ICD-10-CM | POA: Diagnosis not present

## 2018-03-01 DIAGNOSIS — D225 Melanocytic nevi of trunk: Secondary | ICD-10-CM | POA: Diagnosis not present

## 2018-03-01 DIAGNOSIS — C44519 Basal cell carcinoma of skin of other part of trunk: Secondary | ICD-10-CM | POA: Diagnosis not present

## 2018-03-01 DIAGNOSIS — Z85828 Personal history of other malignant neoplasm of skin: Secondary | ICD-10-CM | POA: Diagnosis not present

## 2018-03-01 DIAGNOSIS — L851 Acquired keratosis [keratoderma] palmaris et plantaris: Secondary | ICD-10-CM | POA: Diagnosis not present

## 2018-03-21 DIAGNOSIS — Z1231 Encounter for screening mammogram for malignant neoplasm of breast: Secondary | ICD-10-CM | POA: Diagnosis not present

## 2018-03-21 LAB — HM MAMMOGRAPHY

## 2018-03-26 ENCOUNTER — Encounter: Payer: Medicare Other | Admitting: Family Medicine

## 2018-03-26 ENCOUNTER — Ambulatory Visit: Payer: Medicare Other | Admitting: *Deleted

## 2018-03-28 ENCOUNTER — Ambulatory Visit (INDEPENDENT_AMBULATORY_CARE_PROVIDER_SITE_OTHER): Payer: Medicare Other | Admitting: Family Medicine

## 2018-03-28 ENCOUNTER — Encounter: Payer: Self-pay | Admitting: Family Medicine

## 2018-03-28 ENCOUNTER — Ambulatory Visit (INDEPENDENT_AMBULATORY_CARE_PROVIDER_SITE_OTHER): Payer: Medicare Other

## 2018-03-28 VITALS — BP 138/88 | HR 66 | Temp 98.1°F | Ht 66.0 in | Wt 157.0 lb

## 2018-03-28 VITALS — BP 138/88 | HR 66 | Temp 98.1°F | Ht 66.0 in | Wt 157.4 lb

## 2018-03-28 DIAGNOSIS — Z0000 Encounter for general adult medical examination without abnormal findings: Secondary | ICD-10-CM

## 2018-03-28 DIAGNOSIS — I10 Essential (primary) hypertension: Secondary | ICD-10-CM

## 2018-03-28 DIAGNOSIS — N183 Chronic kidney disease, stage 3 unspecified: Secondary | ICD-10-CM

## 2018-03-28 DIAGNOSIS — Z23 Encounter for immunization: Secondary | ICD-10-CM | POA: Diagnosis not present

## 2018-03-28 DIAGNOSIS — E7800 Pure hypercholesterolemia, unspecified: Secondary | ICD-10-CM

## 2018-03-28 LAB — BASIC METABOLIC PANEL
BUN: 32 mg/dL — ABNORMAL HIGH (ref 6–23)
CO2: 25 mEq/L (ref 19–32)
Calcium: 9.9 mg/dL (ref 8.4–10.5)
Chloride: 105 mEq/L (ref 96–112)
Creatinine, Ser: 1.38 mg/dL — ABNORMAL HIGH (ref 0.40–1.20)
GFR: 39.88 mL/min — ABNORMAL LOW (ref >60.00)
Glucose, Bld: 98 mg/dL (ref 70–99)
Potassium: 4.3 mEq/L (ref 3.5–5.1)
Sodium: 139 mEq/L (ref 135–145)

## 2018-03-28 NOTE — Progress Notes (Signed)
Subjective:   Sierra Summers is a 72 y.o. female who presents for Medicare Annual (Subsequent) preventive examination.  Review of Systems:  No ROS.  Medicare Wellness Visit. Additional risk factors are reflected in the social history. Cardiac Risk Factors include: advanced age (>57men, >63 women) Patient is currently single and lives in a single story ranch home. She has no pets. She is a retired Catering manager. She is active in yoga and pickle ball. Member of a book club. She is currently on the waiting list for housing at [place].     Normally goes to bed whenever she gets tired. Bed time varies greatly. Takes melatonin to sleep. Normally sleeps 6-7 hours. May get up 1-2 times a night to go to the bathroom. Normally feels rested when she wakes up. Objective:     Vitals: BP 138/88 (BP Location: Left Arm, Patient Position: Sitting, Cuff Size: Large)   Pulse 66   Temp 98.1 F (36.7 C) (Oral)   Ht 5\' 6"  (1.676 m)   Wt 157 lb 6.4 oz (71.4 kg)   SpO2 97%   BMI 25.41 kg/m   Body mass index is 25.41 kg/m.  Advanced Directives 03/28/2018 03/20/2017 09/29/2016  Does Patient Have a Medical Advance Directive? Yes Yes Yes  Type of Paramedic of [place];Living will [place];Living will -  Does patient want to make changes to medical advance directive? No - Patient declined No - Patient declined -  Copy of [place] in Chart? Yes Yes -    Tobacco Social History   Tobacco Use  Smoking Status Former Smoker  . Years: 10.00  . Types: Cigarettes  . Last attempt to quit: 07/05/1983  . Years since quitting: 34.7  Smokeless Tobacco Never Used  Tobacco Comment   Pt states she was a binge smoker     Counseling given: Not Answered Comment: Pt states she was a binge smoker   Past Medical History:  Diagnosis Date  . Breast cyst   . Cataract   . Heart murmur   . Hx of phlebitis 09/28/2016  . Hx of skin cancer,  basal cell 09/28/2016  . Hx of squamous cell carcinoma 09/28/2016  . Hyperlipidemia   . Non Hodgkin's lymphoma ([place]) 1989   Past Surgical History:  Procedure Laterality Date  . BONE MARROW TRANSPLANT  1991  . BREAST CYST EXCISION Right   . Laprascopy  1989 and 1992   Family History  Problem Relation Age of Onset  . Heart disease Mother   . Hyperlipidemia Mother   . Hypertension Mother   . Stroke Mother   . Diabetes Father   . Heart disease Father   . Hyperlipidemia Father   . Mental illness Father   . Alcohol abuse Father   . Hyperlipidemia Sister   . Hypertension Sister   . Cancer Maternal Grandmother   . Heart disease Maternal Grandfather   . Mental illness Paternal Grandmother   . Mental illness Paternal Grandfather   . Heart attack Sister   . Stroke Sister   . Dementia Sister   . Parkinson's disease Sister    Social History   Socioeconomic History  . Marital status: Divorced    Spouse name: Not on file  . Number of children: Not on file  . Years of education: Not on file  . Highest education level: Not on file  Occupational History    Employer: [employer]  . Financial  resource strain: Not on file  . Food insecurity:    Worry: Not on file    Inability: Not on file  . Transportation needs:    Medical: Not on file    Non-medical: Not on file  Tobacco Use  . Smoking status: Former Smoker    Years: 10.00    Types: Cigarettes    Last attempt to quit: 07/05/1983    Years since quitting: 34.7  . Smokeless tobacco: Never Used  . Tobacco comment: Pt states she was a binge smoker  Substance and Sexual Activity  . Alcohol use: Yes    Comment: 3-4 drinks/week  . Drug use: No  . Sexual activity: Not Currently    Partners: Male  Lifestyle  . Physical activity:    Days per week: Not on file    Minutes per session: Not on file  . Stress: Not on file  Relationships  . Social connections:    Talks on phone: Not on file    Gets together: Not on  file    Attends religious service: Not on file    Active member of club or organization: Not on file    Attends meetings of clubs or organizations: Not on file    Relationship status: Not on file  Other Topics Concern  . Not on file  Social History Narrative  . Not on file    Outpatient Encounter Medications as of 03/28/2018  Medication Sig  . aspirin EC 81 MG tablet Take 81 mg by mouth as needed.   . Coenzyme Q10 (CO Q 10) 100 MG CAPS Take 1 capsule by mouth daily.  Marland Kitchen losartan (COZAAR) 50 MG tablet Take 1 tablet (50 mg total) daily by mouth.  . MELATONIN PO Take 5 mg by mouth at bedtime.  . Multiple Vitamin (MULTIVITAMIN) tablet Take 1 tablet by mouth daily.  . Omega-3 Fatty Acids (OMEGA 3 PO) Take 1,280 mg by mouth daily.  Marland Kitchen OVER THE COUNTER MEDICATION Take 1 capsule by mouth daily. Cordyceps  . OVER THE COUNTER MEDICATION Take 750 mg by mouth daily. Bacopa  . simvastatin (ZOCOR) 40 MG tablet TAKE HALF TABLET BY MOUTH AT BEDTIME  . [DISCONTINUED] OVER THE COUNTER MEDICATION Take 1 capsule by mouth at bedtime as needed. Ashwagandha Root 350 mg   No facility-administered encounter medications on file as of 03/28/2018.     Activities of Daily Living In your present state of health, do you have any difficulty performing the following activities: 03/28/2018  Hearing? N  Vision? N  Difficulty concentrating or making decisions? N  Walking or climbing stairs? N  Dressing or bathing? N  Doing errands, shopping? N  Preparing Food and eating ? N  Using the Toilet? N  In the past six months, have you accidently leaked urine? N  Do you have problems with loss of bowel control? N  Managing your Medications? N  Managing your Finances? N  Housekeeping or managing your Housekeeping? N  Some recent data might be hidden    Patient Care Team: Briscoe Deutscher, DO as PCP - General (Family Medicine) Otelia Sergeant, OD as Referring Physician Jari Pigg, MD as Consulting Physician  (Dermatology)    Assessment:   This is a routine wellness examination for [facility].  Exercise Activities and Dietary recommendations Current Exercise Habits: Structured exercise class, Type of exercise: stretching;yoga;Other - see comments(Pickle Ball), Time (Minutes): 60, Frequency (Times/Week): 5, Weekly Exercise (Minutes/Week): 300, Intensity: Moderate, Exercise limited by: None identified   Breakfast:  Yogurt, fruit, eggs, toast, maybe bacon, drinks coffee, skips breakfast if she is playing pickle ball that morning  Lunch: [employer] salad, pasta, baked potatoes, fruit, normally drinks water, occasionally tea  Dinner: Salad, water with a glass of wine  Goals    . Exercise 150 min/wk Moderate Activity     Continue to stay active with yoga and pickle ball       Fall Risk Fall Risk  03/28/2018 03/20/2017 09/19/2016  Falls in the past year? No No No    Depression Screen PHQ 2/9 Scores 03/28/2018 03/20/2017 09/19/2016  PHQ - 2 Score 0 0 0     Cognitive Function     6CIT Screen 03/28/2018  What Year? 0 points  What month? 0 points  What time? 0 points  Count back from 20 0 points  Months in reverse 0 points  Repeat phrase 0 points  Total Score 0    Immunization History  Administered Date(s) Administered  . Influenza, High Dose Seasonal PF 03/20/2017  . Pneumococcal Conjugate-13 01/31/2014  . Pneumococcal Polysaccharide-23 01/19/2011  . Tdap 05/02/2007  . Zoster Recombinat (Shingrix) 10/26/2007      Screening Tests Health Maintenance  Topic Date Due  . INFLUENZA VACCINE  02/01/2018  . MAMMOGRAM  02/16/2019  . Fecal DNA (Cologuard)  04/17/2020  . DEXA SCAN  Completed  . Hepatitis C Screening  Completed  . PNA vac Low Risk Adult  Completed        Plan:    Follow Up with PCP as Advised   I have personally reviewed and noted the following in the patient's chart:   . Medical and social history . Use of alcohol, tobacco or illicit drugs  . Current  medications and supplements . Functional ability and status . Nutritional status . Physical activity . Advanced directives . List of other physicians . Vitals . Screenings to include cognitive, depression, and falls . Referrals and appointments  In addition, I have reviewed and discussed with patient certain preventive protocols, quality metrics, and best practice recommendations. A written personalized care plan for preventive services as well as general preventive health recommendations were provided to patient.     [place], [place]  [id number]

## 2018-03-28 NOTE — Progress Notes (Addendum)
I have personally reviewed the Medicare Annual Wellness questionnaire and have noted 1. The patient's medical and social history 2. Their use of alcohol, tobacco or illicit drugs 3. Their current medications and supplements 4. The patient's functional ability including ADL's, fall risks, home safety risks and hearing or visual impairment. 5. Diet and physical activities 6. Evidence for depression or mood disorders 7. Reviewed Updated provider list, see scanned forms and [facility] Snapshot.   The patients weight, height, BMI and visual acuity have been recorded in the chart I have made referrals, counseling and provided education to the patient based review of the above and I have provided the pt with a written personalized care plan for preventive services.  I have provided the patient with a copy of your personalized plan for preventive services. Instructed to take the time to review along with their updated medication list.  Jaikob Borgwardt, DO  

## 2018-03-28 NOTE — Patient Instructions (Addendum)
Ms. Coulson , Thank you for taking time to come for your Medicare Wellness Visit. I appreciate your ongoing commitment to your health goals. Please review the following plan we discussed and let me know if I can assist you in the future.   These are the goals we discussed: Goals    . Exercise 150 min/wk Moderate Activity     Continue to stay active with yoga and pickle ball       This is a list of the screening recommended for you and due dates:  Health Maintenance  Topic Date Due  . Flu Shot  02/01/2018  . Mammogram  02/16/2019  . Cologuard (Stool DNA test)  04/17/2020  . DEXA scan (bone density measurement)  Completed  .  Hepatitis C: One time screening is recommended by Center for Disease Control  (CDC) for  adults born from [birth year] through 1965.   Completed  . Pneumonia vaccines  Completed    Preventive Care for Adults  A healthy lifestyle and preventive care can promote health and wellness. Preventive health guidelines for adults include the following key practices.  . A routine yearly physical is a good way to check with your health care provider about your health and preventive screening. It is a chance to share any concerns and updates on your health and to receive a thorough exam.  . Visit your dentist for a routine exam and preventive care every 6 months. Brush your teeth twice a day and floss once a day. Good oral hygiene prevents tooth decay and gum disease.  . The frequency of eye exams is based on your age, health, family medical history, use  of contact lenses, and other factors. Follow your health care provider's recommendations for frequency of eye exams.  . Eat a healthy diet. Foods like vegetables, fruits, whole grains, low-fat dairy products, and lean protein foods contain the nutrients you need without too many calories. Decrease your intake of foods high in solid fats, added sugars, and salt. Eat the right amount of calories for you. Get information about a  proper diet from your health care provider, if necessary.  . Regular physical exercise is one of the most important things you can do for your health. Most adults should get at least 150 minutes of moderate-intensity exercise (any activity that increases your heart rate and causes you to sweat) each week. In addition, most adults need muscle-strengthening exercises on 2 or more days a week.  Silver Sneakers may be a benefit available to you. To determine eligibility, you may visit the website: www.silversneakers.com or contact program at [phone] Mon-Fri between 8AM-8PM.   . Maintain a healthy weight. The body mass index (BMI) is a screening tool to identify possible weight problems. It provides an estimate of body fat based on height and weight. Your health care provider can find your BMI and can help you achieve or maintain a healthy weight.   For adults 20 years and older: ? A BMI below 18.5 is considered underweight. ? A BMI of 18.5 to 24.9 is normal. ? A BMI of 25 to 29.9 is considered overweight. ? A BMI of 30 and above is considered obese.   . Maintain normal blood lipids and cholesterol levels by exercising and minimizing your intake of saturated fat. Eat a balanced diet with plenty of fruit and vegetables. Blood tests for lipids and cholesterol should begin at age 48 and be repeated every 5 years. If your lipid or cholesterol levels are high,  you are over 50, or you are at high risk for heart disease, you may need your cholesterol levels checked more frequently. Ongoing high lipid and cholesterol levels should be treated with medicines if diet and exercise are not working.  . If you smoke, find out from your health care provider how to quit. If you do not use tobacco, please do not start.  . If you choose to drink alcohol, please do not consume more than 2 drinks per day. One drink is considered to be 12 ounces (355 mL) of beer, 5 ounces (148 mL) of wine, or 1.5 ounces (44 mL) of  liquor.  . If you are 36-82 years old, ask your health care provider if you should take aspirin to prevent strokes.  . Use sunscreen. Apply sunscreen liberally and repeatedly throughout the day. You should seek shade when your shadow is shorter than you. Protect yourself by wearing long sleeves, pants, a wide-brimmed hat, and sunglasses year round, whenever you are outdoors.  . Once a month, do a whole body skin exam, using a mirror to look at the skin on your back. Tell your health care provider of new moles, moles that have irregular borders, moles that are larger than a pencil eraser, or moles that have changed in shape or color.

## 2018-03-28 NOTE — Progress Notes (Signed)
Subjective:    Sierra Summers is a 72 y.o. female and is here for a comprehensive physical exam.  There are no preventive care reminders to display for this patient.  Current Outpatient Medications:  .  aspirin EC 81 MG tablet, Take 81 mg by mouth as needed. , Disp: , Rfl:  .  Coenzyme Q10 (CO Q 10) 100 MG CAPS, Take 1 capsule by mouth daily., Disp: , Rfl:  .  losartan (COZAAR) 50 MG tablet, Take 1 tablet (50 mg total) daily by mouth., Disp: 90 tablet, Rfl: 3 .  MELATONIN PO, Take 5 mg by mouth at bedtime., Disp: , Rfl:  .  Multiple Vitamin (MULTIVITAMIN) tablet, Take 1 tablet by mouth daily., Disp: , Rfl:  .  Omega-3 Fatty Acids (OMEGA 3 PO), Take 1,280 mg by mouth daily., Disp: , Rfl:  .  OVER THE COUNTER MEDICATION, Take 1 capsule by mouth daily. Cordyceps, Disp: , Rfl:  .  OVER THE COUNTER MEDICATION, Take 750 mg by mouth daily. Bacopa, Disp: , Rfl:  .  simvastatin (ZOCOR) 40 MG tablet, TAKE HALF TABLET BY MOUTH AT BEDTIME, Disp: 45 tablet, Rfl: 2  PMHx, SurgHx, SocialHx, Medications, and Allergies were reviewed in the Visit Navigator and updated as appropriate.   Past Medical History:  Diagnosis Date  . Breast cyst   . Cataract   . Heart murmur   . Hx of phlebitis 09/28/2016  . 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 Hx of squamous cell carcinoma 09/28/2016  . Hyperlipidemia   . Non Hodgkin's lymphoma ([place]) 1989     Past Surgical History:  Procedure Laterality Date  . BONE MARROW TRANSPLANT  1991  . BREAST CYST EXCISION Right   . Laprascopy  1989 and 1992     Family History  Problem Relation Age of Onset  . Heart disease Mother   . Hyperlipidemia Mother   . Hypertension Mother   . Stroke Mother   . Diabetes Father   . Heart disease Father   . Hyperlipidemia Father   . Mental illness Father   . Alcohol abuse Father   . Hyperlipidemia Sister   . Hypertension Sister   . Cancer Maternal Grandmother   . Heart disease Maternal Grandfather   .  Mental illness Paternal Grandmother   . Mental illness Paternal Grandfather   . Heart attack Sister   . Stroke Sister   . Dementia Sister   . Parkinson's disease Sister     Social History   Tobacco Use  . Smoking status: Former Smoker    Years: 10.00    Types: Cigarettes    Last attempt to quit: 07/05/1983    Years since quitting: 34.7  . Smokeless tobacco: Never Used  . Tobacco comment: Pt states she was a binge smoker  Substance Use Topics  . Alcohol use: Yes    Comment: 3-4 drinks/week  . Drug use: No    Review of Systems:   Pertinent items are noted in the HPI. Otherwise, ROS is negative.  Objective:   BP 138/88   Pulse 66   Temp 98.1 F (36.7 C) (Oral)   Ht 5\' 6"  (1.676 m)   Wt 157 lb (71.2 kg)   SpO2 97%   BMI 25.34 kg/m   General appearance: alert, cooperative and appears stated age. Head: normocephalic, without obvious abnormality, atraumatic. Neck: no adenopathy, supple, symmetrical, trachea midline; thyroid not enlarged, symmetric, no tenderness/mass/nodules. Lungs: clear to auscultation bilaterally. Heart: regular rate  and rhythm Abdomen: soft, non-tender; no masses,  no organomegaly. Extremities: extremities normal, atraumatic, no cyanosis or edema. Skin: skin color, texture, turgor normal, no rashes or lesions. Lymph: cervical, supraclavicular, and axillary nodes normal; no abnormal inguinal nodes palpated. Neurologic: grossly normal.  Assessment/Plan:   Diagnoses and all orders for this visit:  Routine physical examination  CKD (chronic kidney disease) stage 3, GFR 30-59 ml/min (HCC) -     Basic metabolic panel  Essential hypertension  Pure hypercholesterolemia  Encounter for immunization -     Flu vaccine HIGH DOSE PF   Patient Counseling: [x]    Nutrition: Stressed importance of moderation in sodium/caffeine intake, saturated fat and cholesterol, caloric balance, sufficient intake of fresh fruits, vegetables, fiber, calcium, iron, and  1 mg of folate supplement per day (for females capable of pregnancy).  [x]    Stressed the importance of regular exercise.   [x]    Substance Abuse: Discussed cessation/primary prevention of tobacco, alcohol, or other drug use; driving or other dangerous activities under the influence; availability of treatment for abuse.   [x]    Injury prevention: Discussed safety belts, safety helmets, smoke detector, smoking near bedding or upholstery.   [x]    Sexuality: Discussed sexually transmitted diseases, partner selection, use of condoms, avoidance of unintended pregnancy  and contraceptive alternatives.  [x]    Dental health: Discussed importance of regular tooth brushing, flossing, and dental visits.  [x]    Health maintenance and immunizations reviewed. Please refer to Health maintenance section.   Briscoe Deutscher, DO [place]

## 2018-03-28 NOTE — Progress Notes (Signed)
PCP notes:Last OV 02/12/18    Health maintenance:Flu Shot due   Abnormal screenings: None   Patient concerns: None   Nurse concerns:None   Next PCP appt: 03/28/18 at 2:00pm

## 2018-03-29 ENCOUNTER — Encounter: Payer: Self-pay | Admitting: Physical Therapy

## 2018-03-29 ENCOUNTER — Other Ambulatory Visit: Payer: Self-pay

## 2018-03-29 DIAGNOSIS — N183 Chronic kidney disease, stage 3 unspecified: Secondary | ICD-10-CM

## 2018-04-01 ENCOUNTER — Encounter: Payer: Self-pay | Admitting: Family Medicine

## 2018-04-09 DIAGNOSIS — C44519 Basal cell carcinoma of skin of other part of trunk: Secondary | ICD-10-CM | POA: Diagnosis not present

## 2018-04-10 ENCOUNTER — Telehealth: Payer: Self-pay | Admitting: Family Medicine

## 2018-04-10 NOTE — Telephone Encounter (Signed)
Copied from [place] [phone]. Topic: General - Other >> Apr 10, 2018 12:42 PM Lennox Solders wrote: Reason for CRM: pt is calling and she has exercise induce asthma and would like to proceed with getting an inhaler . [facility] on [address]. Pt originally decline inhaler when dr wallace offer to prescribe one

## 2018-04-10 NOTE — Telephone Encounter (Signed)
Please advise 

## 2018-04-11 MED ORDER — ALBUTEROL SULFATE HFA 108 (90 BASE) MCG/ACT IN AERS: 2.0000 | INHALATION_SPRAY | Freq: Four times a day (QID) | RESPIRATORY_TRACT | 0 refills | Status: DC | PRN

## 2018-04-11 NOTE — Telephone Encounter (Signed)
Inhaler sent to pharmacy. Left message on phone that prescription has been sent in.

## 2018-04-11 NOTE — Addendum Note (Signed)
Addended by: Durwin Glaze on: 04/11/2018 08:47 AM   Modules accepted: Orders

## 2018-04-11 NOTE — Telephone Encounter (Signed)
Please send in albuterol inhaler to requested pharmacy.

## 2018-05-02 ENCOUNTER — Other Ambulatory Visit: Payer: Self-pay | Admitting: Family Medicine

## 2018-05-02 DIAGNOSIS — I10 Essential (primary) hypertension: Secondary | ICD-10-CM

## 2018-06-04 ENCOUNTER — Encounter: Payer: Self-pay | Admitting: Family Medicine

## 2018-07-27 ENCOUNTER — Other Ambulatory Visit: Payer: Self-pay | Admitting: Family Medicine

## 2018-07-27 DIAGNOSIS — E785 Hyperlipidemia, unspecified: Secondary | ICD-10-CM

## 2018-08-08 ENCOUNTER — Ambulatory Visit (INDEPENDENT_AMBULATORY_CARE_PROVIDER_SITE_OTHER): Payer: Medicare Other | Admitting: Family Medicine

## 2018-08-08 VITALS — BP 132/84 | HR 81 | Temp 98.6°F | Ht 66.0 in | Wt 156.8 lb

## 2018-08-08 DIAGNOSIS — I10 Essential (primary) hypertension: Secondary | ICD-10-CM

## 2018-08-08 DIAGNOSIS — F418 Other specified anxiety disorders: Secondary | ICD-10-CM | POA: Diagnosis not present

## 2018-08-08 DIAGNOSIS — N183 Chronic kidney disease, stage 3 unspecified: Secondary | ICD-10-CM

## 2018-08-08 DIAGNOSIS — E785 Hyperlipidemia, unspecified: Secondary | ICD-10-CM

## 2018-08-08 LAB — COMPREHENSIVE METABOLIC PANEL
ALT: 28 U/L (ref 0–35)
AST: 28 U/L (ref 0–37)
Albumin: 4.8 g/dL (ref 3.5–5.2)
Alkaline Phosphatase: 62 U/L (ref 39–117)
BUN: 31 mg/dL — ABNORMAL HIGH (ref 6–23)
CO2: 23 mEq/L (ref 19–32)
Calcium: 9.9 mg/dL (ref 8.4–10.5)
Chloride: 105 mEq/L (ref 96–112)
Creatinine, Ser: 1.41 mg/dL — ABNORMAL HIGH (ref 0.40–1.20)
GFR: 36.56 mL/min — ABNORMAL LOW (ref >60.00)
Glucose, Bld: 105 mg/dL — ABNORMAL HIGH (ref 70–99)
Potassium: 4.7 mEq/L (ref 3.5–5.1)
Sodium: 139 mEq/L (ref 135–145)
Total Bilirubin: 0.7 mg/dL (ref 0.2–1.2)
Total Protein: 7.3 g/dL (ref 6.0–8.3)

## 2018-08-08 LAB — PHOSPHORUS: Phosphorus: 2.9 mg/dL (ref 2.3–4.6)

## 2018-08-08 LAB — CBC WITH DIFFERENTIAL/PLATELET
Basophils Absolute: 0 10*3/uL (ref 0.0–0.1)
Basophils Relative: 0.7 % (ref 0.0–3.0)
Eosinophils Absolute: 0.1 10*3/uL (ref 0.0–0.7)
Eosinophils Relative: 1.2 % (ref 0.0–5.0)
HCT: 42.6 % (ref 36.0–46.0)
Hemoglobin: 14.5 g/dL (ref 12.0–15.0)
Lymphocytes Relative: 33.2 % (ref 12.0–46.0)
Lymphs Abs: 1.4 10*3/uL (ref 0.7–4.0)
MCHC: 34.1 g/dL (ref 30.0–36.0)
MCV: 98.3 fl (ref 78.0–100.0)
Monocytes Absolute: 0.4 10*3/uL (ref 0.1–1.0)
Monocytes Relative: 10.7 % (ref 3.0–12.0)
Neutro Abs: 2.2 10*3/uL (ref 1.4–7.7)
Neutrophils Relative %: 54.2 % (ref 43.0–77.0)
Platelets: 277 10*3/uL (ref 150.0–400.0)
RBC: 4.33 Mil/uL (ref 3.87–5.11)
RDW: 12.7 % (ref 11.5–15.5)
WBC: 4.1 10*3/uL (ref 4.0–10.5)

## 2018-08-08 LAB — MICROALBUMIN / CREATININE URINE RATIO
Creatinine,U: 179.8 mg/dL
Microalb Creat Ratio: 1.7 mg/g (ref 0.0–30.0)
Microalb, Ur: 3.1 mg/dL — ABNORMAL HIGH (ref 0.0–1.9)

## 2018-08-08 LAB — MAGNESIUM: Magnesium: 2 mg/dL (ref 1.5–2.5)

## 2018-08-08 MED ORDER — SIMVASTATIN 40 MG PO TABS: 20.0000 mg | ORAL_TABLET | Freq: Every day | ORAL | 1 refills | Status: DC

## 2018-08-08 MED ORDER — LOSARTAN POTASSIUM 50 MG PO TABS: 50.0000 mg | ORAL_TABLET | Freq: Every day | ORAL | 2 refills | Status: DC

## 2018-08-08 NOTE — Progress Notes (Signed)
Sierra Summers is a 73 y.o. female is here for follow up.  History of Present Illness:   [name], CMA acting as scribe for Dr. Briscoe Deutscher.   HPI:   Review: taking medications as instructed, no medication side effects noted, no TIAs, no chest pain on exertion, no dyspnea on exertion, no swelling of ankles. Smoker: No. BP Readings from Last 3 Encounters:  08/08/18 132/84  03/28/18 138/88  03/28/18 138/88   Lab Results  Component Value Date   CREATININE 1.41 (H) 08/08/2018   CREATININE 1.38 (H) 03/28/2018   CREATININE 1.44 (H) 02/27/2018     Is the patient taking medications without problems? Yes. Does the patient complain of muscle aches?  No. Trying to exercise on a regular basis? Yes. Compliant with diet? Yes.  Lab Results  Component Value Date   CHOL 156 02/12/2018   HDL 61.70 02/12/2018   LDLCALC 78 02/12/2018   TRIG 81.0 02/12/2018   CHOLHDL 3 02/12/2018   Lab Results  Component Value Date   ALT 28 08/08/2018   AST 28 08/08/2018   ALKPHOS 62 08/08/2018   BILITOT 0.7 08/08/2018     Also, she has additional complaints of having a difficult time with the idea of transitioning to ALF. She signed up for [facility] more than a year ago and expects to have a place available next year. She tried staying for a night as part of a program offered and could not stay due to anxiety.  There are no preventive care reminders to display for this patient. Depression screen [facility] 2/9 08/08/2018 03/28/2018 03/20/2017  Decreased Interest 0 0 0  Down, Depressed, Hopeless 0 0 0  PHQ - 2 Score 0 0 0  Altered sleeping 0 - -  Tired, decreased energy 0 - -  Change in appetite 0 - -  Feeling bad or failure about yourself  0 - -  Trouble concentrating 0 - -  Moving slowly or fidgety/restless 0 - -  Suicidal thoughts 0 - -  PHQ-9 Score 0 - -  Difficult doing work/chores Not difficult at all - -   PMHx, SurgHx, SocialHx, FamHx, Medications, and Allergies were reviewed in  the Visit Navigator and updated as appropriate.   Patient Active Problem List   Diagnosis Date Noted  . Essential hypertension 04/16/2017  . Osteopenia 03/13/2017  . 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 Hx of phlebitis 09/28/2016  . 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 Hx of squamous cell carcinoma 09/28/2016  . CKD (chronic kidney disease) stage 3, GFR 30-59 ml/min (HCC) 09/28/2016  . Sciatica, left side 09/19/2016  . Heart murmur   . Hyperlipidemia 09/20/2006  . Hx of non-Hodgkin's lymphoma 07/05/1987   Social History   Tobacco Use  . Smoking status: Former Smoker    Years: 10.00    Types: Cigarettes    Last attempt to quit: 07/05/1983    Years since quitting: 35.1  . Smokeless tobacco: Never Used  . Tobacco comment: Pt states she was a binge smoker  Substance Use Topics  . Alcohol use: Yes    Comment: 3-4 drinks/week  . Drug use: No   Current Medications and Allergies   .  albuterol (PROVENTIL HFA;VENTOLIN HFA) 108 (90 Base) MCG/ACT inhaler, Inhale 2 puffs into the lungs every 6 (six) hours as needed for wheezing or shortness of breath. (Patient not taking: Reported on 08/08/2018), Disp: 1 Inhaler, Rfl: 0 .  aspirin EC 81 MG  tablet, Take 81 mg by mouth as needed. , Disp: , Rfl:  .  Black Pepper-Turmeric (TURMERIC CURCUMIN) 11-998 MG CAPS, Take 1 capsule by mouth daily., Disp: , Rfl:  .  Coenzyme Q10 (CO Q 10) 100 MG CAPS, Take 1 capsule by mouth daily., Disp: , Rfl:  .  losartan (COZAAR) 50 MG tablet, take 1 tablet by mouth daily (Patient not taking: Reported on 08/08/2018), Disp: 90 tablet, Rfl: 2 .  MELATONIN PO, Take 5 mg by mouth at bedtime., Disp: , Rfl:  .  Multiple Vitamin (MULTIVITAMIN) tablet, Take 1 tablet by mouth daily., Disp: , Rfl:  .  Omega-3 Fatty Acids (OMEGA 3 PO), Take 1,280 mg by mouth daily., Disp: , Rfl:  .  OVER THE COUNTER MEDICATION, Take 1 capsule by mouth daily. Cordyceps, Disp: , Rfl:  .  OVER THE COUNTER MEDICATION, Take 750 mg by  mouth daily. Bacopa, Disp: , Rfl:  .  simvastatin (ZOCOR) 40 MG tablet, TAKE HALF TABLET BY MOUTH AT BEDTIME  (Patient not taking: Reported on 08/08/2018), Disp: 45 tablet, Rfl: 1   Allergies  Allergen Reactions  . Sulfa Antibiotics Rash   Review of Systems   Pertinent items are noted in the HPI. Otherwise, a complete ROS is negative.  Vitals   Vitals:   08/08/18 0801  BP: 132/84  Pulse: 81  Temp: 98.6 F (37 C)  TempSrc: Oral  SpO2: 96%  Weight: 156 lb 12.8 oz (71.1 kg)  Height: 5\' 6"  (1.676 m)     Body mass index is 25.31 kg/m.  Physical Exam   Physical Exam Vitals signs and nursing note reviewed.  HENT:     Head: Normocephalic and atraumatic.  Eyes:     Pupils: Pupils are equal, round, and reactive to light.  Neck:     Musculoskeletal: Normal range of motion and neck supple.  Cardiovascular:     Rate and Rhythm: Normal rate and regular rhythm.     Heart sounds: Normal heart sounds.  Pulmonary:     Effort: Pulmonary effort is normal.  Abdominal:     Palpations: Abdomen is soft.  Skin:    General: Skin is warm.  Psychiatric:        Behavior: Behavior normal.     Results for orders placed or performed in visit on 08/08/18  CBC with Differential/Platelet  Result Value Ref Range   WBC 4.1 4.0 - 10.5 K/uL   RBC 4.33 3.87 - 5.11 Mil/uL   Hemoglobin 14.5 12.0 - 15.0 g/dL   HCT 42.6 36.0 - 46.0 %   MCV 98.3 78.0 - 100.0 fl   MCHC 34.1 30.0 - 36.0 g/dL   RDW 12.7 11.5 - 15.5 %   Platelets 277.0 150.0 - 400.0 K/uL   Neutrophils Relative % 54.2 43.0 - 77.0 %   Lymphocytes Relative 33.2 12.0 - 46.0 %   Monocytes Relative 10.7 3.0 - 12.0 %   Eosinophils Relative 1.2 0.0 - 5.0 %   Basophils Relative 0.7 0.0 - 3.0 %   Neutro Abs 2.2 1.4 - 7.7 K/uL   Lymphs Abs 1.4 0.7 - 4.0 K/uL   Monocytes Absolute 0.4 0.1 - 1.0 K/uL   Eosinophils Absolute 0.1 0.0 - 0.7 K/uL   Basophils Absolute 0.0 0.0 - 0.1 K/uL  Comprehensive metabolic panel  Result Value Ref Range    Sodium 139 135 - 145 mEq/L   Potassium 4.7 3.5 - 5.1 mEq/L   Chloride 105 96 - 112 mEq/L   CO2  23 19 - 32 mEq/L   Glucose, Bld 105 (H) 70 - 99 mg/dL   BUN 31 (H) 6 - 23 mg/dL   Creatinine, Ser 1.41 (H) 0.40 - 1.20 mg/dL   Total Bilirubin 0.7 0.2 - 1.2 mg/dL   Alkaline Phosphatase 62 39 - 117 U/L   AST 28 0 - 37 U/L   ALT 28 0 - 35 U/L   Total Protein 7.3 6.0 - 8.3 g/dL   Albumin 4.8 3.5 - 5.2 g/dL   Calcium 9.9 8.4 - 10.5 mg/dL   GFR 36.56 (L) >60.00 mL/min  Microalbumin / creatinine urine ratio  Result Value Ref Range   Microalb, Ur 3.1 (H) 0.0 - 1.9 mg/dL   Creatinine,U 179.8 mg/dL   Microalb Creat Ratio 1.7 0.0 - 30.0 mg/g  Magnesium  Result Value Ref Range   Magnesium 2.0 1.5 - 2.5 mg/dL  Phosphorus  Result Value Ref Range   Phosphorus 2.9 2.3 - 4.6 mg/dL    Assessment and Plan   [place] was seen today for follow-up.  Diagnoses and all orders for this visit:  CKD (chronic kidney disease) stage 3, GFR 30-59 ml/min (HCC) Comments: Referral to [facility] for baseline check-in.  Orders: -     CBC with Differential/Platelet -     Comprehensive metabolic panel -     Microalbumin / creatinine urine ratio -     Magnesium -     Phosphorus -     Ambulatory referral to Nephrology  Essential hypertension Comments: Controlled on current regimen.  Orders: -     losartan (COZAAR) 50 MG tablet; Take 1 tablet (50 mg total) by mouth daily. -     Comprehensive metabolic panel -     Microalbumin / creatinine urine ratio  Hyperlipidemia, unspecified hyperlipidemia type Comments: Refill today. Recheck lipid at CPE in July. Orders: -     simvastatin (ZOCOR) 40 MG tablet; Take 0.5 tablets (20 mg total) by mouth daily at 6 PM. -     Comprehensive metabolic panel  Situational anxiety Comments: Gave therapist number for couseling during transition.     . Orders and follow up as documented in [place], reviewed diet, exercise and weight control, cardiovascular risk and  specific lipid/LDL goals reviewed, reviewed medications and side effects in detail.  . Reviewed expectations re: course of current medical issues. . Outlined signs and symptoms indicating need for more acute intervention. . Patient verbalized understanding and all questions were answered. . Patient received an After Visit Summary.  CMA served as Education administrator during this visit. History, Physical, and Plan performed by medical provider. The above documentation has been reviewed and is accurate and complete. Briscoe Deutscher, D.O.  Briscoe Deutscher, DO [place], [place] 08/09/2018

## 2018-08-09 ENCOUNTER — Encounter: Payer: Self-pay | Admitting: Family Medicine

## 2018-12-21 ENCOUNTER — Ambulatory Visit (INDEPENDENT_AMBULATORY_CARE_PROVIDER_SITE_OTHER): Payer: Medicare Other | Admitting: Family Medicine

## 2018-12-21 ENCOUNTER — Encounter: Payer: Self-pay | Admitting: Family Medicine

## 2018-12-21 ENCOUNTER — Other Ambulatory Visit: Payer: Self-pay

## 2018-12-21 VITALS — BP 148/68 | HR 78 | Temp 98.2°F | Ht 66.0 in | Wt 160.0 lb

## 2018-12-21 DIAGNOSIS — R21 Rash and other nonspecific skin eruption: Secondary | ICD-10-CM | POA: Diagnosis not present

## 2018-12-21 DIAGNOSIS — I10 Essential (primary) hypertension: Secondary | ICD-10-CM | POA: Diagnosis not present

## 2018-12-21 NOTE — Patient Instructions (Signed)
It was very nice to see you today!  You do not have lyme disease.  The spot on your leg should improve over the next several days to next few weeks.  He can use over-the-counter hydrocortisone cream as needed.  Keep an eye on your blood pressure and let me or Dr. Juleen China know if persistently elevated to the 150s over 90s or above.  Take care, Dr [name]

## 2018-12-21 NOTE — Progress Notes (Signed)
   Chief Complaint:  Sierra Summers is a 73 y.o. female who presents for same day appointment with a chief complaint of rash.   Assessment/Plan:  Rash Likely local inflammatory reaction due to insect bite/sting.  She is very low likelihood of Lyme disease given that she had no known tick bites and low prevalence in [place].  Reassured patient.  She voiced understanding.  Recommended cortisone cream as needed for redness and itchiness.  Discussed reasons to return to care.  Follow-up as needed.  Essential hypertension Slightly elevated today.  Patient thinks this is mostly due to anxiety.  Typically well controlled at home.  She is actually at goal per JNC 8 guidelines.  Continue losartan 50 mg daily.  Continue home blood pressure monitoring.     Subjective:  HPI:  Rash, acute problem Patient was working in her garden 5 days ago when she noticed a bright red bump to the inner aspect of her right leg.  Over the next few days the areas become darker and has taken on more of a bull's-eye characteristic.  She looked online and was concerned about possible Lyme disease.  No obvious insect bites.  No tick bites.  No obvious exposures.  Area is nonpainful.  No drainage.  No fevers or chills.  No body aches.  No treatments tried. No other obvious alleviating or aggravating factors.   ROS: Per HPI  PMH: She reports that she quit smoking about 35 years ago. Her smoking use included cigarettes. She quit after 10.00 years of use. She has never used smokeless tobacco. She reports current alcohol use. She reports that she does not use drugs.      Objective:  Physical Exam: BP (!) 148/68 (BP Location: Left Arm, Patient Position: Sitting, Cuff Size: Normal)   Pulse 78   Temp 98.2 F (36.8 C) (Oral)   Ht 5\' 6"  (1.676 m)   Wt 160 lb (72.6 kg)   SpO2 99%   BMI 25.82 kg/m   Gen: NAD, resting comfortably CV: Regular rate and rhythm with no murmurs appreciated Pulm: Normal work of  breathing, clear to auscultation bilaterally with no crackles, wheezes, or rhonchi Skin: Approximately 1.5 cm purplish red rash on right inner thigh with central clearing.  No erythema.  No Summers.  No drainage.      Sierra Summers. Sierra Pain, MD 12/21/2018 1:34 PM

## 2019-02-06 ENCOUNTER — Ambulatory Visit (INDEPENDENT_AMBULATORY_CARE_PROVIDER_SITE_OTHER): Payer: Medicare Other | Admitting: Physician Assistant

## 2019-02-06 ENCOUNTER — Encounter: Payer: Self-pay | Admitting: Physician Assistant

## 2019-02-06 ENCOUNTER — Other Ambulatory Visit: Payer: Self-pay

## 2019-02-06 VITALS — BP 150/76 | HR 71 | Temp 98.1°F | Ht 66.0 in | Wt 157.5 lb

## 2019-02-06 DIAGNOSIS — I10 Essential (primary) hypertension: Secondary | ICD-10-CM | POA: Diagnosis not present

## 2019-02-06 DIAGNOSIS — E785 Hyperlipidemia, unspecified: Secondary | ICD-10-CM

## 2019-02-06 LAB — BASIC METABOLIC PANEL
BUN: 31 mg/dL — ABNORMAL HIGH (ref 6–23)
CO2: 25 mEq/L (ref 19–32)
Calcium: 9.3 mg/dL (ref 8.4–10.5)
Chloride: 104 mEq/L (ref 96–112)
Creatinine, Ser: 1.27 mg/dL — ABNORMAL HIGH (ref 0.40–1.20)
GFR: 41.2 mL/min — ABNORMAL LOW (ref >60.00)
Glucose, Bld: 104 mg/dL — ABNORMAL HIGH (ref 70–99)
Potassium: 4.9 mEq/L (ref 3.5–5.1)
Sodium: 137 mEq/L (ref 135–145)

## 2019-02-06 LAB — LIPID PANEL
Cholesterol: 159 mg/dL (ref 0–200)
HDL: 61.5 mg/dL (ref >39.00)
LDL Cholesterol: 83 mg/dL (ref 0–99)
NonHDL: 97.32
Total CHOL/HDL Ratio: 3
Triglycerides: 74 mg/dL (ref 0.0–149.0)
VLDL: 14.8 mg/dL (ref 0.0–40.0)

## 2019-02-06 NOTE — Patient Instructions (Addendum)
It was great to see you!  Let's try to reduce the alcohol to see if that can help your blood pressure.  Goal is <150/90.  I will be in touch regarding your cholesterol and if we need to change anything.  Follow-up for your Annual Wellness Visit any time AFTER September 25.  Take care,  Inda Coke PA-C

## 2019-02-06 NOTE — Progress Notes (Signed)
Sierra Summers is a 73 y.o. female is here to follow up on blood pressure.  I acted as a Education administrator for [employer], PA-C Anselmo Pickler, LPN  History of Present Illness:   Chief Complaint  Patient presents with  . Hypertension    HPI   Hypertension Pt here for follow up today on blood pressure. She is currently on Losartan 50 mg daily. She does not check blood pressure at home. Pt denies headaches, dizziness, blurred vision, chest pain, SOB or lower leg edema. Denies excessive caffeine intake, stimulant usage, excessive alcohol intake or increase in salt consumption.  She does report that she drinks one drink daily. Currently drinks about 1 [place] a night.  She is very active -- walks and play pickle ball regularly.  She also has HLD and takes her cholesterol medication religiously.  Health Maintenance Due  Topic Date Due  . INFLUENZA VACCINE  02/02/2019    Past Medical History:  Diagnosis Date  . Breast cyst   . Cataract   . Heart murmur   . Hx of phlebitis 09/28/2016  . 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 Hx of squamous cell carcinoma 09/28/2016  . Hyperlipidemia   . Non Hodgkin's lymphoma ([place]) 1989     Social History   Socioeconomic History  . Marital status: Divorced    Spouse name: Not on file  . Number of children: Not on file  . Years of education: Not on file  . Highest education level: Not on file  Occupational History    Employer: [employer]  . Financial resource strain: Not on file  . Food insecurity    Worry: Not on file    Inability: Not on file  . Transportation needs    Medical: Not on file    Non-medical: Not on file  Tobacco Use  . Smoking status: Former Smoker    Years: 10.00    Types: Cigarettes    Quit date: 07/05/1983    Years since quitting: 35.6  . Smokeless tobacco: Never Used  . Tobacco comment: Pt states she was a binge smoker  Substance and Sexual Activity  . Alcohol use: Yes   Comment: 3-4 drinks/week  . Drug use: No  . Sexual activity: Not Currently    Partners: Male  Lifestyle  . Physical activity    Days per week: Not on file    Minutes per session: Not on file  . Stress: Not on file  Relationships  . Social Herbalist on phone: Not on file    Gets together: Not on file    Attends religious service: Not on file    Active member of club or organization: Not on file    Attends meetings of clubs or organizations: Not on file    Relationship status: Not on file  . Intimate partner violence    Fear of current or ex partner: Not on file    Emotionally abused: Not on file    Physically abused: Not on file    Forced sexual activity: Not on file  Other Topics Concern  . Not on file  Social History Narrative  . Not on file    Past Surgical History:  Procedure Laterality Date  . BONE MARROW TRANSPLANT  1991  . BREAST CYST EXCISION Right   . Laprascopy  1989 and 1992    Family History  Problem Relation Age of Onset  . Heart disease  Mother   . Hyperlipidemia Mother   . Hypertension Mother   . Stroke Mother   . Diabetes Father   . Heart disease Father   . Hyperlipidemia Father   . Mental illness Father   . Alcohol abuse Father   . Hyperlipidemia Sister   . Hypertension Sister   . Cancer Maternal Grandmother   . Heart disease Maternal Grandfather   . Mental illness Paternal Grandmother   . Mental illness Paternal Grandfather   . Heart attack Sister   . Stroke Sister   . Dementia Sister   . Parkinson's disease Sister     PMHx, SurgHx, SocialHx, FamHx, Medications, and Allergies were reviewed in the Visit Navigator and updated as appropriate.   Patient Active Problem List   Diagnosis Date Noted  . Essential hypertension 04/16/2017  . Osteopenia 03/13/2017  . 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 Hx of phlebitis 09/28/2016  . 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 Hx of squamous cell carcinoma 09/28/2016  . CKD  (chronic kidney disease) stage 3, GFR 30-59 ml/min (HCC) 09/28/2016  . Sciatica, left side 09/19/2016  . Heart murmur   . Hyperlipidemia 09/20/2006  . Hx of non-Hodgkin's lymphoma 07/05/1987    Social History   Tobacco Use  . Smoking status: Former Smoker    Years: 10.00    Types: Cigarettes    Quit date: 07/05/1983    Years since quitting: 35.6  . Smokeless tobacco: Never Used  . Tobacco comment: Pt states she was a binge smoker  Substance Use Topics  . Alcohol use: Yes    Comment: 3-4 drinks/week  . Drug use: No    Current Medications and Allergies:    Current Outpatient Medications:  .  albuterol (PROVENTIL HFA;VENTOLIN HFA) 108 (90 Base) MCG/ACT inhaler, Inhale 2 puffs into the lungs every 6 (six) hours as needed for wheezing or shortness of breath., Disp: 1 Inhaler, Rfl: 0 .  aspirin EC 81 MG tablet, Take 81 mg by mouth as needed. , Disp: , Rfl:  .  Black Pepper-Turmeric (TURMERIC CURCUMIN) 11-998 MG CAPS, Take 1 capsule by mouth daily., Disp: , Rfl:  .  Coenzyme Q10 (CO Q 10) 100 MG CAPS, Take 1 capsule by mouth daily., Disp: , Rfl:  .  losartan (COZAAR) 50 MG tablet, Take 1 tablet (50 mg total) by mouth daily., Disp: 90 tablet, Rfl: 2 .  MELATONIN PO, Take 5 mg by mouth at bedtime., Disp: , Rfl:  .  Multiple Vitamin (MULTIVITAMIN) tablet, Take 1 tablet by mouth daily., Disp: , Rfl:  .  Omega-3 Fatty Acids (OMEGA 3 PO), Take 1,280 mg by mouth daily., Disp: , Rfl:  .  OVER THE COUNTER MEDICATION, Take 1 capsule by mouth daily. Cordyceps, Disp: , Rfl:  .  OVER THE COUNTER MEDICATION, Take 750 mg by mouth daily. Bacopa, Disp: , Rfl:  .  simvastatin (ZOCOR) 40 MG tablet, Take 0.5 tablets (20 mg total) by mouth daily at 6 PM., Disp: 45 tablet, Rfl: 1   Allergies  Allergen Reactions  . Sulfa Antibiotics Rash    Review of Systems   ROS Negative unless otherwise specified per HPI.  Vitals:   Vitals:   02/06/19 0806  BP: (!) 150/76  Pulse: 71  Temp: 98.1 F (36.7  C)  TempSrc: Temporal  SpO2: 97%  Weight: 157 lb 8 oz (71.4 kg)  Height: 5\' 6"  (1.676 m)     Body mass index is 25.42 kg/m.  Physical Exam:   Physical Exam Vitals signs and nursing note reviewed.  Constitutional:      General: She is not in acute distress.    Appearance: She is well-developed. She is not ill-appearing or toxic-appearing.  Cardiovascular:     Rate and Rhythm: Normal rate and regular rhythm.     Pulses: Normal pulses.     Heart sounds: Normal heart sounds, S1 normal and S2 normal.     Comments: No LE edema Pulmonary:     Effort: Pulmonary effort is normal.     Breath sounds: Normal breath sounds.  Skin:    General: Skin is warm and dry.  Neurological:     Mental Status: She is alert.     GCS: GCS eye subscore is 4. GCS verbal subscore is 5. GCS motor subscore is 6.  Psychiatric:        Speech: Speech normal.        Behavior: Behavior normal. Behavior is cooperative.       Assessment and Plan:    Naydeen was seen today for hypertension.  Diagnoses and all orders for this visit:  Hyperlipidemia, unspecified hyperlipidemia type Recheck lipid panel today. -     Lipid panel  Essential hypertension Continue current regimen, she is very reluctant to increase medications and technically with her age she is within guidelines of <150/90. Recommended working on alcohol reduction to see if this helps. I also will recheck BMP to assess kidney function to see if there has been recent decline. -     Basic metabolic panel  . Reviewed expectations re: course of current medical issues. . Discussed self-management of symptoms. . Outlined signs and symptoms indicating need for more acute intervention. . Patient verbalized understanding and all questions were answered. . See orders for this visit as documented in the electronic medical record. . Patient received an After Visit Summary.  CMA or LPN served as scribe during this visit. History, Physical, and Plan  performed by medical provider. The above documentation has been reviewed and is accurate and complete.  Inda Coke, PA-C [place], [place] 02/06/2019  Follow-up: No follow-ups on file.

## 2019-02-18 ENCOUNTER — Encounter: Payer: Self-pay | Admitting: Physician Assistant

## 2019-02-26 DIAGNOSIS — H2513 Age-related nuclear cataract, bilateral: Secondary | ICD-10-CM | POA: Diagnosis not present

## 2019-02-27 ENCOUNTER — Encounter: Payer: Self-pay | Admitting: Family Medicine

## 2019-03-04 DIAGNOSIS — D225 Melanocytic nevi of trunk: Secondary | ICD-10-CM | POA: Diagnosis not present

## 2019-03-04 DIAGNOSIS — Z85828 Personal history of other malignant neoplasm of skin: Secondary | ICD-10-CM | POA: Diagnosis not present

## 2019-03-04 DIAGNOSIS — L821 Other seborrheic keratosis: Secondary | ICD-10-CM | POA: Diagnosis not present

## 2019-03-04 DIAGNOSIS — B360 Pityriasis versicolor: Secondary | ICD-10-CM | POA: Diagnosis not present

## 2019-03-04 DIAGNOSIS — D239 Other benign neoplasm of skin, unspecified: Secondary | ICD-10-CM | POA: Diagnosis not present

## 2019-03-04 DIAGNOSIS — L851 Acquired keratosis [keratoderma] palmaris et plantaris: Secondary | ICD-10-CM | POA: Diagnosis not present

## 2019-03-27 ENCOUNTER — Encounter: Payer: Self-pay | Admitting: Family Medicine

## 2019-03-27 DIAGNOSIS — Z1231 Encounter for screening mammogram for malignant neoplasm of breast: Secondary | ICD-10-CM | POA: Diagnosis not present

## 2019-03-28 ENCOUNTER — Telehealth: Payer: Self-pay | Admitting: Family Medicine

## 2019-03-28 NOTE — Telephone Encounter (Signed)
I called the patient to schedule AWV with [name].  Sierra Summers said that Sierra Summers cancelled it because Sierra Summers's transitioning to [facility] and just wants to see Dr. Juleen China. VDM (Dee-Dee)

## 2019-03-29 DIAGNOSIS — R928 Other abnormal and inconclusive findings on diagnostic imaging of breast: Secondary | ICD-10-CM | POA: Diagnosis not present

## 2019-03-29 DIAGNOSIS — Z1231 Encounter for screening mammogram for malignant neoplasm of breast: Secondary | ICD-10-CM | POA: Diagnosis not present

## 2019-03-29 DIAGNOSIS — N6489 Other specified disorders of breast: Secondary | ICD-10-CM | POA: Diagnosis not present

## 2019-03-29 LAB — HM MAMMOGRAPHY

## 2019-04-09 ENCOUNTER — Encounter: Payer: Self-pay | Admitting: Family Medicine

## 2019-04-09 ENCOUNTER — Other Ambulatory Visit: Payer: Self-pay

## 2019-04-09 ENCOUNTER — Ambulatory Visit (INDEPENDENT_AMBULATORY_CARE_PROVIDER_SITE_OTHER): Payer: Medicare Other | Admitting: Family Medicine

## 2019-04-09 ENCOUNTER — Ambulatory Visit: Payer: Medicare Other

## 2019-04-09 VITALS — BP 130/80 | HR 100 | Temp 97.8°F | Ht 66.0 in | Wt 153.8 lb

## 2019-04-09 DIAGNOSIS — N1832 Chronic kidney disease, stage 3b: Secondary | ICD-10-CM

## 2019-04-09 DIAGNOSIS — M8580 Other specified disorders of bone density and structure, unspecified site: Secondary | ICD-10-CM | POA: Diagnosis not present

## 2019-04-09 DIAGNOSIS — E7800 Pure hypercholesterolemia, unspecified: Secondary | ICD-10-CM | POA: Diagnosis not present

## 2019-04-09 DIAGNOSIS — Z0000 Encounter for general adult medical examination without abnormal findings: Secondary | ICD-10-CM | POA: Diagnosis not present

## 2019-04-09 DIAGNOSIS — I10 Essential (primary) hypertension: Secondary | ICD-10-CM | POA: Diagnosis not present

## 2019-04-09 NOTE — Progress Notes (Signed)
Subjective:    Sierra Summers is a 73 y.o. female and is here for a comprehensive physical exam.  There are no preventive care reminders to display for this patient.   Current Outpatient Medications:  .  albuterol (PROVENTIL HFA;VENTOLIN HFA) 108 (90 Base) MCG/ACT inhaler, Inhale 2 puffs into the lungs every 6 (six) hours as needed for wheezing or shortness of breath., Disp: 1 Inhaler, Rfl: 0 .  aspirin EC 81 MG tablet, Take 81 mg by mouth as needed. , Disp: , Rfl:  .  Black Pepper-Turmeric (TURMERIC CURCUMIN) 11-998 MG CAPS, Take 1 capsule by mouth daily., Disp: , Rfl:  .  Coenzyme Q10 (CO Q 10) 100 MG CAPS, Take 1 capsule by mouth daily., Disp: , Rfl:  .  losartan (COZAAR) 50 MG tablet, Take 1 tablet (50 mg total) by mouth daily., Disp: 90 tablet, Rfl: 2 .  MELATONIN PO, Take 5 mg by mouth at bedtime., Disp: , Rfl:  .  Multiple Vitamin (MULTIVITAMIN) tablet, Take 1 tablet by mouth daily., Disp: , Rfl:  .  Omega-3 Fatty Acids (OMEGA 3 PO), Take 1,280 mg by mouth daily., Disp: , Rfl:  .  OVER THE COUNTER MEDICATION, Take 1 capsule by mouth daily. Cordyceps, Disp: , Rfl:  .  OVER THE COUNTER MEDICATION, Take 750 mg by mouth daily. Bacopa, Disp: , Rfl:  .  simvastatin (ZOCOR) 40 MG tablet, Take 0.5 tablets (20 mg total) by mouth daily at 6 PM., Disp: 45 tablet, Rfl: 1  PMHx, SurgHx, SocialHx, Medications, and Allergies were reviewed in the Visit Navigator and updated as appropriate.   Past Medical History:  Diagnosis Date  . Breast cyst   . Cataract   . Heart murmur   . Hx of phlebitis 09/28/2016  . 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 Hx of squamous cell carcinoma 09/28/2016  . Hyperlipidemia   . 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 BONE MARROW TRANSPLANT  1991  . BREAST CYST EXCISION Right   . Laprascopy  1989 and 1992     Family History  Problem Relation Age of Onset  . Heart disease Mother   .  Hyperlipidemia Mother   . Hypertension Mother   . Stroke Mother   . Diabetes Father   . Heart disease Father   . Hyperlipidemia Father   . Mental illness Father   . Alcohol abuse Father   . Hyperlipidemia Sister   . Hypertension Sister   . Cancer Maternal Grandmother   . Heart disease Maternal Grandfather   . Mental illness Paternal Grandmother   . Mental illness Paternal Grandfather   . Heart attack Sister   . Stroke Sister   . Dementia Sister   . Parkinson's disease Sister     Social History   Tobacco Use  . Smoking status: Former Smoker    Years: 10.00    Types: Cigarettes    Quit date: 07/05/1983    Years since quitting: 35.7  . Smokeless tobacco: Never Used  . Tobacco comment: Pt states she was a binge smoker  Substance Use Topics  . Alcohol use: Yes    Comment: 3-4 drinks/week  . Drug use: No    Review of Systems:   Pertinent items are noted in the HPI. Otherwise, ROS is negative.  Objective:   BP 130/80 (BP Location: Left Arm, Patient Position: Sitting, Cuff Size: Normal)   Pulse 100  Temp 97.8 F (36.6 C) (Temporal)   Ht 5\' 6"  (1.676 m)   Wt 153 lb 12.8 oz (69.8 kg)   SpO2 97%   BMI 24.82 kg/m   General appearance: alert, cooperative and appears stated age. Head: normocephalic, without obvious abnormality, atraumatic. Neck: no adenopathy, supple, symmetrical, trachea midline; thyroid not enlarged, symmetric, no tenderness/mass/nodules. Lungs: clear to auscultation bilaterally. Heart: regular rate and rhythm Abdomen: soft, non-tender; no masses,  no organomegaly. Extremities: extremities normal, atraumatic, no cyanosis or edema. Skin: skin color, texture, turgor normal, no rashes or lesions. Lymph: cervical, supraclavicular, and axillary nodes normal; no abnormal inguinal nodes palpated. Neurologic: grossly normal.                 Assessment/Plan:   Sierra Summers was seen today for annual exam.  Diagnoses and all orders for this visit:  Routine  physical examination  Essential hypertension  Pure hypercholesterolemia  Stage 3b chronic kidney disease  Osteopenia, unspecified location    Patient Counseling: [x]    Nutrition: Stressed importance of moderation in sodium/caffeine intake, saturated fat and cholesterol, caloric balance, sufficient intake of fresh fruits, vegetables, fiber, calcium, iron, and 1 mg of folate supplement per day (for females capable of pregnancy).  [x]    Stressed the importance of regular exercise.   [x]    Substance Abuse: Discussed cessation/primary prevention of tobacco, alcohol, or other drug use; driving or other dangerous activities under the influence; availability of treatment for abuse.   [x]    Injury prevention: Discussed safety belts, safety helmets, smoke detector, smoking near bedding or upholstery.   [x]    Sexuality: Discussed sexually transmitted diseases, partner selection, use of condoms, avoidance of unintended pregnancy  and contraceptive alternatives.  [x]    Dental health: Discussed importance of regular tooth brushing, flossing, and dental visits.  [x]    Health maintenance and immunizations reviewed. Please refer to Health maintenance section.   Sierra Deutscher, DO [facility]

## 2019-04-10 ENCOUNTER — Ambulatory Visit: Payer: Medicare Other

## 2019-04-15 ENCOUNTER — Other Ambulatory Visit: Payer: Self-pay

## 2019-04-15 ENCOUNTER — Encounter: Payer: Self-pay | Admitting: Family Medicine

## 2019-04-15 ENCOUNTER — Ambulatory Visit (INDEPENDENT_AMBULATORY_CARE_PROVIDER_SITE_OTHER): Payer: Medicare Other

## 2019-04-15 DIAGNOSIS — Z23 Encounter for immunization: Secondary | ICD-10-CM

## 2019-06-05 ENCOUNTER — Telehealth: Payer: Self-pay | Admitting: Family Medicine

## 2019-06-05 NOTE — Telephone Encounter (Signed)
Patient is very concerned and would like a personal call from [facility] once this has been resolved.   Copied from [place] [id number]. Topic: Quick Communication - See Telephone Encounter >> May 27, 2019 10:14 AM Parke Poisson wrote: CRM for notification. See Telephone encounter for: 05/27/19. Pt states that she is getting a bill for her 04/09/19 visit Medicare Wellness visit which she said should be free to her. She thinks maybe it was coded wring >> 06-19-2019 12:08 PM Celene Kras wrote: Pt calling to check on status of resubmitting the bill. Please advise.  >> May 27, 2019  1:16 PM Jerene Dilling H wrote: Durene Cal to [facility]

## 2019-06-05 NOTE — Telephone Encounter (Signed)
HI Sierra Summers,   I forwarded the email I sent to [name] on 11/23 in regards to this patient.  I can not change coding due to the documentation, it does support the cpe and nothing else, however, patient has Medicare. I saw a note in scheduling that stated AWV declined by the patient.  If this is the case and she wanted the CPE then she is responsible for the bill.  I was not sure if that was correct or not.  Thanks, [employer]

## 2019-06-25 NOTE — Telephone Encounter (Signed)
I called and spoke to patient. I explained that according to our records that she was scheduled for an AWV that she declined. I verified that her primary insurance was Medicare and explained and she confirmed that it was. I then explained to her that Medicare does not cover physicals and her visit was clearly a physical. I also explained that I had coding review it to ensure that there was no coding change needed. She thanked me for my time and ended the call.

## 2019-06-25 NOTE — Telephone Encounter (Signed)
Patient called back and is requesting a call from you to follow up. I spoke with the patient and advised that it appeared our Engineer, building services and the coding department were still trying to work through this. I attempted to help however the patient wants to speak with you personally. Can you contact the patient to discuss further?

## 2019-06-26 ENCOUNTER — Other Ambulatory Visit: Payer: Self-pay

## 2019-06-26 ENCOUNTER — Encounter: Payer: Self-pay | Admitting: Internal Medicine

## 2019-06-26 ENCOUNTER — Non-Acute Institutional Stay: Payer: Medicare Other | Admitting: Internal Medicine

## 2019-06-26 VITALS — BP 132/82 | HR 77 | Temp 97.9°F | Ht 66.0 in | Wt 156.0 lb

## 2019-06-26 DIAGNOSIS — Z9481 Bone marrow transplant status: Secondary | ICD-10-CM

## 2019-06-26 DIAGNOSIS — I10 Essential (primary) hypertension: Secondary | ICD-10-CM | POA: Diagnosis not present

## 2019-06-26 DIAGNOSIS — H259 Unspecified age-related cataract: Secondary | ICD-10-CM

## 2019-06-26 DIAGNOSIS — M5432 Sciatica, left side: Secondary | ICD-10-CM | POA: Diagnosis not present

## 2019-06-26 DIAGNOSIS — Z8572 Personal history of non-Hodgkin lymphomas: Secondary | ICD-10-CM

## 2019-06-26 DIAGNOSIS — N1832 Chronic kidney disease, stage 3b: Secondary | ICD-10-CM

## 2019-06-26 DIAGNOSIS — R011 Cardiac murmur, unspecified: Secondary | ICD-10-CM

## 2019-06-26 DIAGNOSIS — M8580 Other specified disorders of bone density and structure, unspecified site: Secondary | ICD-10-CM

## 2019-06-26 DIAGNOSIS — E7800 Pure hypercholesterolemia, unspecified: Secondary | ICD-10-CM

## 2019-06-26 NOTE — Progress Notes (Signed)
Provider:  Rexene Edison. Mariea Clonts, D.O., C.M.D. Location:  Artist of Service:  Clinic (12)  Previous PCP: Gayland Curry, DO Patient Care Team: Gayland Curry, DO as PCP - General (Geriatric Medicine) Otelia Sergeant, OD as Referring Physician Jari Pigg, MD as Consulting Physician (Dermatology)  Extended Emergency Contact Information Primary Emergency Contact: Rau,Martha Address: [address]          [place], [place] [address] Johnnette Litter of [place] Phone: [phone] Mobile Phone: [phone] Relation: Sister  Code Status: DNR Goals of Care: Advanced Directive information Advanced Directives 06/26/2019  Does Patient Have a Medical Advance Directive? Yes  Type of Paramedic of [place];Living will;Out of facility DNR (pink MOST or yellow form)  Does patient want to make changes to medical advance directive? No - Patient declined  Copy of [place] in Chart? Yes - validated most recent copy scanned in chart (See row information)  Pre-existing out of facility DNR order (yellow form or pink MOST form) Yellow form placed in chart (order not valid for inpatient use)    Chief Complaint  Patient presents with  . Establish Care    New Patient    HPI: Patient is a 73 y.o. female seen today to establish with [facility].  Records are in epic from Dr. Juleen China.   She has a h/o hyperlipidemia, htn, insomnia, CKD3, osteopenia, non-Hodgkin's lymphoma, and left-sided sciatica.  She's here to establish care due to living in [place] at [facility] since November.  She is a very independent person and does not feel lonely missing some of the activities and things.  She goes by [employer].  She'd like to get involved in the [facility] exercise programs.    She was born outside of [place] and flew around as a Catering manager.    She smoked in her 8s and 30s for 10 years and now drinks wine a little more than her  5-6 glasses per week.    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    She has a living will, hcpoa, DNR.    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  She had two ex-laps, 12 intensive wks of chemo b/w, and then had bone marrow transplant in [place], [place] with her bone marrow--was part of an experimental program--#5 getting that done for blood diseases in that study.  She had bene married and living in [place] then.  She got divorced amid that.  Came up to [facility] for radiation recommendations and then had that done in [place].  Was being treated for 2 years.  After several years of regular checkups and imaging.  Then stopped all of those imaging studies.  Has been free since.    CKD3:  Likely related to all of her radiation and chemo.    HTN:  Has been on medication for bp a long time--about 5 years.    Hyperlipidemia:  On cholesterol meds at least 10 years.  Squamous cell skin ca:  Did bake herself in 20s and 30s.  Goes to [employer] on [address].  Heart murmur:  Only echo done amid lymphoma treatments.  Discovered as teenager.    Phlebitis at 73yo.  Osteopenia:  Likely radiation induced.  She'd been almost 5'8" and now 5'6.5".  Last bone density 9/18.  Has had sciatica on her left side.  Started taking turmeric which helped her.  [place] flipped when sledding as a child and she had some pain on the left side.  She plays pickle ball and walks.  She does still get numbness in her big toe and her dorsal foot at time.    Past Medical History:  Diagnosis Date  . Breast cyst   . Cataract   . Chronic kidney disease, stage 3   . Heart murmur   . Hx of phlebitis 09/28/2016  . 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 Hx of squamous cell carcinoma 09/28/2016  . Hyperlipidemia   . Hypertension   . Non Hodgkin's lymphoma ([place]) 1989  . Osteopenia   . Phlebitis     . Sciatica of left side    Past Surgical History:  Procedure Laterality Date  . BONE MARROW TRANSPLANT  1991  . BREAST CYST EXCISION Right   . Laprascopy  1989 and 1992    Social History   Socioeconomic History  . Marital status: Divorced    Spouse name: Not on file  . Number of children: Not on file  . Years of education: Not on file  . Highest education level: Not on file  Occupational History    Employer: [employer]  Tobacco Use  . Smoking status: Former Smoker    Years: 10.00    Types: Cigarettes    Quit date: 07/05/1983    Years since quitting: 36.0  . Smokeless tobacco: Never Used  . Tobacco comment: Pt states she was a binge smoker  Substance and Sexual Activity  . Alcohol use: Yes    Comment: 5-6 drinks a week  . Drug use: No  . Sexual activity: Not Currently    Partners: Male  Other Topics Concern  . Not on file  Social History Narrative   Social History      Diet? n/a      Do you drink/eat things with caffeine? Mostly no       Marital status?                        n/a            What year were you married?      Do you live in a house, apartment, assisted living, condo, trailer, etc.? apartment      Is it one or more stories? 3       How many persons live in your home? 1      Do you have any pets in your home? no      Highest level of education completed? Some college       Current or past profession: flight attendant      Do you exercise?                 yes                     Type & how often? Every day       Advanced Directives      Do you have a living will? yes      Do you have a DNR form?                                  If not, do you want to discuss one? Yes      Do you have signed  POA/HPOA for forms? yes      Functional Status      Do you have difficulty bathing or dressing yourself? No       Do you have difficulty preparing food or eating? No       Do you have difficulty managing your medications? No       Do you have  difficulty managing your finances? No       Do you have difficulty affording your medications? No       Social Determinants of Health   Financial Resource Strain:   . Difficulty of Paying Living Expenses: Not on file  Food Insecurity:   . Worried About Charity fundraiser in the Last Year: Not on file  . Ran Out of Food in the Last Year: Not on file  Transportation Needs:   . Lack of Transportation (Medical): Not on file  . Lack of Transportation (Non-Medical): Not on file  Physical Activity:   . Days of Exercise per Week: Not on file  . Minutes of Exercise per Session: Not on file  Stress:   . Feeling of Stress : Not on file  Social Connections:   . Frequency of Communication with Friends and Family: Not on file  . Frequency of Social Gatherings with Friends and Family: Not on file  . Attends Religious Services: Not on file  . Active Member of Clubs or Organizations: Not on file  . Attends Archivist Meetings: Not on file  . Marital Status: Not on file    reports that she quit smoking about 36 years ago. Her smoking use included cigarettes. She quit after 10.00 years of use. She has never used smokeless tobacco. She reports current alcohol use. She reports that she does not use drugs.  Functional Status Survey:    Family History  Problem Relation Age of Onset  . Heart disease Mother   . Hyperlipidemia Mother   . Hypertension Mother   . Stroke Mother   . Diabetes Father   . Heart disease Father   . Hyperlipidemia Father   . Mental illness Father   . Alcohol abuse Father   . Hyperlipidemia Sister   . Hypertension Sister   . Cancer Maternal Grandmother   . Heart disease Maternal Grandfather   . Mental illness Paternal Grandmother   . Mental illness Paternal Grandfather   . Heart attack Sister   . Stroke Sister   . Dementia Sister   . High blood pressure Sister   . High Cholesterol Sister   . Parkinson's disease Sister     Health Maintenance  Topic  Date Due  . Fecal DNA (Cologuard)  04/17/2020  . MAMMOGRAM  03/28/2021  . INFLUENZA VACCINE  Completed  . DEXA SCAN  Completed  . Hepatitis C Screening  Completed  . PNA vac Low Risk Adult  Completed    Allergies  Allergen Reactions  . Sulfa Antibiotics Rash    Outpatient Encounter Medications as of 06/26/2019  Medication Sig  . aspirin EC 81 MG tablet Take 81 mg by mouth as needed.   Renard Hamper Pepper-Turmeric (TURMERIC CURCUMIN) 11-998 MG CAPS Take 1 capsule by mouth daily.  . Coenzyme Q10 (CO Q 10) 100 MG CAPS Take 1 capsule by mouth daily.  Marland Kitchen losartan (COZAAR) 50 MG tablet Take 1 tablet (50 mg total) by mouth daily.  Marland Kitchen MELATONIN PO Take 5 mg by mouth at bedtime.  . Multiple Vitamin (MULTIVITAMIN) tablet Take 1 tablet by  mouth daily.  . Omega-3 Fatty Acids (OMEGA 3 PO) Take 1,280 mg by mouth daily.  Marland Kitchen OVER THE COUNTER MEDICATION Take 1 capsule by mouth daily. Cordyceps  . OVER THE COUNTER MEDICATION Take 750 mg by mouth daily. Bacopa  . simvastatin (ZOCOR) 40 MG tablet Take 0.5 tablets (20 mg total) by mouth daily at 6 PM.   No facility-administered encounter medications on file as of 06/26/2019.    Review of Systems  Constitutional: Negative for chills, fever, malaise/fatigue and weight loss.  HENT: Positive for hearing loss. Negative for congestion.   Eyes: Negative for blurred vision.       Glasses, cataracts  Respiratory: Negative for cough and shortness of breath.   Cardiovascular: Negative for chest pain, palpitations and leg swelling.       Murmur--no investigation ever done, no symptoms; htn  Gastrointestinal: Negative for abdominal pain, blood in stool, constipation, diarrhea, heartburn and melena.  Genitourinary: Negative for dysuria.  Musculoskeletal: Negative for falls and joint pain.       Osteopenia  Skin: Negative for itching and rash.  Neurological: Negative for dizziness, loss of consciousness, weakness and headaches.  Endo/Heme/Allergies: Does not  bruise/bleed easily.  Psychiatric/Behavioral: Negative for depression and memory loss. The patient is not nervous/anxious and does not have insomnia.     Vitals:   06/26/19 1435  BP: 132/82  Pulse: 77  Temp: 97.9 F (36.6 C)  TempSrc: Temporal  SpO2: 96%  Weight: 156 lb (70.8 kg)  Height: 5\' 6"  (1.676 m)   Body mass index is 25.18 kg/m. Physical Exam Vitals reviewed.  Constitutional:      General: She is not in acute distress.    Appearance: Normal appearance. She is normal weight. She is not toxic-appearing.  HENT:     Head: Normocephalic and atraumatic.     Nose:     Comments: covid masking for nose and mouth Eyes:     Comments: glasses  Cardiovascular:     Rate and Rhythm: Normal rate and regular rhythm.     Heart sounds: Murmur present.  Pulmonary:     Effort: Pulmonary effort is normal.     Breath sounds: Normal breath sounds. No wheezing, rhonchi or rales.  Abdominal:     General: Bowel sounds are normal.  Musculoskeletal:        General: Normal range of motion.     Cervical back: Normal range of motion.     Right lower leg: No edema.     Left lower leg: No edema.  Skin:    General: Skin is warm and dry.  Neurological:     General: No focal deficit present.     Mental Status: She is alert and oriented to person, place, and time.  Psychiatric:        Mood and Affect: Mood normal.        Behavior: Behavior normal.        Thought Content: Thought content normal.        Judgment: Judgment normal.     Labs reviewed: Basic Metabolic Panel: Recent Labs    08/08/18 0845 02/06/19 0834  NA 139 137  K 4.7 4.9  CL 105 104  CO2 23 25  GLUCOSE 105* 104*  BUN 31* 31*  CREATININE 1.41* 1.27*  CALCIUM 9.9 9.3  MG 2.0  --   PHOS 2.9  --    Liver Function Tests: Recent Labs    08/08/18 0845  AST 28  ALT 28  ALKPHOS  62  BILITOT 0.7  PROT 7.3  ALBUMIN 4.8   No results for input(s): LIPASE, AMYLASE in the last 8760 hours. No results for input(s):  AMMONIA in the last 8760 hours. CBC: Recent Labs    08/08/18 0845  WBC 4.1  NEUTROABS 2.2  HGB 14.5  HCT 42.6  MCV 98.3  PLT 277.0   Cardiac Enzymes: No results for input(s): CKTOTAL, CKMB, CKMBINDEX, TROPONINI in the last 8760 hours. BNP: Invalid input(s): POCBNP No results found for: HGBA1C No results found for: TSH Lab Results  Component Value Date   VITAMINB12 388 01/31/2014   Imaging and Procedures noted on new patient packet: cologuard negative 2018 Mammogram normal 2020 Hep c screen 2017 Pneumonia 2015 Bone density 2018  Assessment/Plan 1. Sciatica of left side -intermittent, but not terribly bothersome lately, continues with her regular exercise regimen   2. Age-related cataract of both eyes, unspecified age-related cataract type -continue to follow with ophtho  3. Essential hypertension -bp well controlled with current regimen (just losartan) and exercise  4. Osteopenia, unspecified location -per last bone density--would f/u with next mammo  5. Stage 3b chronic kidney disease -Avoid nephrotoxic agents like nsaids, dose adjust renally excreted meds, hydrate. -was related to her treatments for NHL  6. Heart murmur -has been present since teen--asymptomatic, monitor  7. Pure hypercholesterolemia -has been controlled, takes zocor--cont and check labs before next visit  8. History of non-Hodgkin's lymphoma -was treated with chemo, xrt and BMT, remission  9. 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 Fordyce Lepak, D.O. [facility] [address], [place] [address] Cell Phone (Mon-Fri 8am-5pm):  [phone] On Call:  [phone] & follow prompts after 5pm & weekends Office Phone:  [phone] Office Fax:  [phone]

## 2019-06-27 ENCOUNTER — Encounter: Payer: Self-pay | Admitting: Internal Medicine

## 2019-06-27 DIAGNOSIS — H269 Unspecified cataract: Secondary | ICD-10-CM | POA: Insufficient documentation

## 2019-06-27 DIAGNOSIS — I10 Essential (primary) hypertension: Secondary | ICD-10-CM | POA: Insufficient documentation

## 2019-06-27 DIAGNOSIS — N183 Chronic kidney disease, stage 3 unspecified: Secondary | ICD-10-CM | POA: Insufficient documentation

## 2019-06-27 DIAGNOSIS — M5432 Sciatica, left side: Secondary | ICD-10-CM | POA: Insufficient documentation

## 2019-07-11 DIAGNOSIS — Z20828 Contact with and (suspected) exposure to other viral communicable diseases: Secondary | ICD-10-CM | POA: Diagnosis not present

## 2019-07-16 DIAGNOSIS — Z23 Encounter for immunization: Secondary | ICD-10-CM | POA: Diagnosis not present

## 2019-07-31 ENCOUNTER — Other Ambulatory Visit: Payer: Self-pay | Admitting: Family Medicine

## 2019-07-31 DIAGNOSIS — E785 Hyperlipidemia, unspecified: Secondary | ICD-10-CM

## 2019-08-05 ENCOUNTER — Other Ambulatory Visit: Payer: Self-pay | Admitting: *Deleted

## 2019-08-05 DIAGNOSIS — E785 Hyperlipidemia, unspecified: Secondary | ICD-10-CM

## 2019-08-05 MED ORDER — SIMVASTATIN 40 MG PO TABS: 20.0000 mg | ORAL_TABLET | Freq: Every day | ORAL | 1 refills | Status: DC

## 2019-08-05 NOTE — Telephone Encounter (Signed)
Patient requested refill. Faxed.  

## 2019-08-13 DIAGNOSIS — Z23 Encounter for immunization: Secondary | ICD-10-CM | POA: Diagnosis not present

## 2019-09-16 ENCOUNTER — Encounter: Payer: Self-pay | Admitting: Family

## 2019-09-16 ENCOUNTER — Other Ambulatory Visit: Payer: Self-pay

## 2019-09-16 ENCOUNTER — Ambulatory Visit (INDEPENDENT_AMBULATORY_CARE_PROVIDER_SITE_OTHER): Payer: Medicare Other | Admitting: Family

## 2019-09-16 DIAGNOSIS — Z0000 Encounter for general adult medical examination without abnormal findings: Secondary | ICD-10-CM | POA: Diagnosis not present

## 2019-09-16 NOTE — Progress Notes (Signed)
Subjective:   Sierra Summers is a 74 y.o. female who presents for Medicare Annual (Subsequent) preventive examination.  Review of Systems:   Cardiac Risk Factors include: advanced age (>58men, >18 women);hypertension;smoking/ tobacco exposure     Objective:     Vitals: There were no vitals taken for this visit.  There is no height or weight on file to calculate BMI.  Advanced Directives 09/16/2019 06/26/2019 03/28/2018 03/20/2017 09/29/2016  Does Patient Have a Medical Advance Directive? Yes Yes Yes Yes Yes  Type of Paramedic of [place];Living will;Out of facility DNR (pink MOST or yellow form) [place];Living will;Out of facility DNR (pink MOST or yellow form) [place];Living will [place];Living will -  Does patient want to make changes to medical advance directive? No - Patient declined No - Patient declined No - Patient declined No - Patient declined -  Copy of [place] in Chart? Yes - validated most recent copy scanned in chart (See row information) Yes - validated most recent copy scanned in chart (See row information) Yes Yes -  Pre-existing out of facility DNR order (yellow form or pink MOST form) - Yellow form placed in chart (order not valid for inpatient use) - - -    Tobacco Social History   Tobacco Use  Smoking Status Former Smoker  . Years: 10.00  . Types: Cigarettes  . Quit date: 07/05/1983  . Years since quitting: 36.2  Smokeless Tobacco Never Used  Tobacco Comment   Pt states she was a binge smoker     Counseling given: Not Answered Comment: Pt states she was a binge smoker   Clinical Intake:  Pre-visit preparation completed: No  Pain : No/denies pain     BMI - recorded: 25.18 Nutritional Status: BMI 25 -29 Overweight Nutritional Risks: None Diabetes: No  How often do you need to have someone help you when you read instructions,  pamphlets, or other written materials from your doctor or pharmacy?: 1 - Never What is the last grade level you completed in school?: College  Interpreter Needed?: No  Information entered by :: Princessa Lesmeister FNP-C  Past Medical History:  Diagnosis Date  . Breast cyst   . Cataract   . Chronic kidney disease, stage 3   . Heart murmur   . Hx of phlebitis 09/28/2016  . 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 Hx of squamous cell carcinoma 09/28/2016  . Hyperlipidemia   . Hypertension   . Non Hodgkin's lymphoma ([place]) 1989  . Osteopenia   . Phlebitis   . Sciatica of left side    Past Surgical History:  Procedure Laterality Date  . BONE MARROW TRANSPLANT  1991  . BREAST CYST EXCISION Right   . Laprascopy  1989 and 1992   Family History  Problem Relation Age of Onset  . Heart disease Mother   . Hyperlipidemia Mother   . Hypertension Mother   . Stroke Mother   . Diabetes Father   . Heart disease Father   . Hyperlipidemia Father   . Mental illness Father   . Alcohol abuse Father   . Hyperlipidemia Sister   . Hypertension Sister   . Cancer Maternal Grandmother   . Heart disease Maternal Grandfather   . Mental illness Paternal Grandmother   . Mental illness Paternal Grandfather   . Heart attack Sister   . Stroke Sister   . Dementia Sister   .  High blood pressure Sister   . High Cholesterol Sister   . Parkinson's disease Sister    Social History   Socioeconomic History  . Marital status: Divorced    Spouse name: Not on file  . Number of children: Not on file  . Years of education: Not on file  . Highest education level: Not on file  Occupational History    Employer: [employer]  Tobacco Use  . Smoking status: Former Smoker    Years: 10.00    Types: Cigarettes    Quit date: 07/05/1983    Years since quitting: 36.2  . Smokeless tobacco: Never Used  . Tobacco comment: Pt states she was a binge smoker  Substance and Sexual Activity  . Alcohol use: Yes     Comment: 5-6 drinks a week  . Drug use: No  . Sexual activity: Not Currently    Partners: Male  Other Topics Concern  . Not on file  Social History Narrative   Social History      Diet? n/a      Do you drink/eat things with caffeine? Mostly no       Marital status?                        n/a            What year were you married?      Do you live in a house, apartment, assisted living, condo, trailer, etc.? apartment      Is it one or more stories? 3       How many persons live in your home? 1      Do you have any pets in your home? no      Highest level of education completed? Some college       Current or past profession: flight attendant      Do you exercise?                 yes                     Type & how often? Every day       Advanced Directives      Do you have a living will? yes      Do you have a DNR form?                                  If not, do you want to discuss one? Yes      Do you have signed POA/HPOA for forms? yes      Functional Status      Do you have difficulty bathing or dressing yourself? No       Do you have difficulty preparing food or eating? No       Do you have difficulty managing your medications? No       Do you have difficulty managing your finances? No       Do you have difficulty affording your medications? No       Social Determinants of Health   Financial Resource Strain:   . Difficulty of Paying Living Expenses:   Food Insecurity:   . Worried About Charity fundraiser in the Last Year:   . Arboriculturist in the Last Year:   Transportation Needs:   . Film/video editor (Medical):   Marland Kitchen  Lack of Transportation (Non-Medical):   Physical Activity:   . Days of Exercise per Week:   . Minutes of Exercise per Session:   Stress:   . Feeling of Stress :   Social Connections:   . Frequency of Communication with Friends and Family:   . Frequency of Social Gatherings with Friends and Family:   . Attends Religious  Services:   . Active Member of Clubs or Organizations:   . Attends Archivist Meetings:   Marland Kitchen Marital Status:     Outpatient Encounter Medications as of 09/16/2019  Medication Sig  . aspirin EC 81 MG tablet Take 81 mg by mouth as needed.   Renard Hamper Pepper-Turmeric (TURMERIC CURCUMIN) 11-998 MG CAPS Take 1 capsule by mouth daily.  . Coenzyme Q10 (CO Q 10) 100 MG CAPS Take 1 capsule by mouth daily.  Marland Kitchen losartan (COZAAR) 50 MG tablet Take 1 tablet (50 mg total) by mouth daily.  Marland Kitchen MELATONIN PO Take 5 mg by mouth at bedtime.  . Multiple Vitamin (MULTIVITAMIN) tablet Take 1 tablet by mouth daily.  . Omega-3 Fatty Acids (OMEGA 3 PO) Take 1,280 mg by mouth daily.  Marland Kitchen OVER THE COUNTER MEDICATION Take 1 capsule by mouth daily. Cordyceps  . OVER THE COUNTER MEDICATION Take 750 mg by mouth daily. Bacopa  . simvastatin (ZOCOR) 40 MG tablet Take 0.5 tablets (20 mg total) by mouth daily at 6 PM.   No facility-administered encounter medications on file as of 09/16/2019.    Activities of Daily Living In your present state of health, do you have any difficulty performing the following activities: 09/16/2019  Hearing? N  Vision? N  Difficulty concentrating or making decisions? N  Walking or climbing stairs? N  Dressing or bathing? N  Doing errands, shopping? N  Preparing Food and eating ? N  Using the Toilet? N  In the past six months, have you accidently leaked urine? N  Do you have problems with loss of bowel control? N  Managing your Medications? N  Managing your Finances? N  Housekeeping or managing your Housekeeping? N  Comment has house keeping  Some recent data might be hidden    Patient Care Team: Gayland Curry, DO as PCP - General (Geriatric Medicine) Otelia Sergeant, OD as Referring Physician Jari Pigg, MD as Consulting Physician (Dermatology)    Assessment:   This is a routine wellness examination for [facility].  Exercise Activities and Dietary recommendations Current  Exercise Habits: Home exercise routine, Type of exercise: yoga;walking, Time (Minutes): 45, Frequency (Times/Week): 6, Weekly Exercise (Minutes/Week): 270, Intensity: Moderate  Goals    . Exercise 150 min/wk Moderate Activity     Continue to stay active with yoga and pickle ball       Fall Risk Fall Risk  09/16/2019 06/26/2019 08/08/2018 03/28/2018 03/20/2017  Falls in the past year? 0 0 0 No No  Number falls in past yr: 0 0 0 - -  Injury with Fall? 0 0 0 - -   Is the patient's home free of loose throw rugs in walkways, pet beds, electrical cords, etc?   no      Grab bars in the bathroom? yes      Handrails on the stairs?   yes      Adequate lighting?   yes  Depression Screen PHQ 2/9 Scores 09/16/2019 06/26/2019 04/09/2019 08/08/2018  PHQ - 2 Score 0 0 0 0  PHQ- 9 Score - - 2 0  Cognitive Function     6CIT Screen 09/16/2019 03/28/2018  What Year? 0 points 0 points  What month? 0 points 0 points  What time? 0 points 0 points  Count back from 20 0 points 0 points  Months in reverse 0 points 0 points  Repeat phrase 0 points 0 points  Total Score 0 0    Immunization History  Administered Date(s) Administered  . Fluad Quad(high Dose 65+) 04/15/2019  . Influenza, High Dose Seasonal PF 03/20/2017, 03/28/2018  . Pneumococcal Conjugate-13 01/31/2014  . Pneumococcal Polysaccharide-23 01/19/2011  . Tdap 05/02/2007  . Zoster Recombinat (Shingrix) 10/26/2007    Qualifies for Shingles Vaccine? Second dose of shingrix due 2021   Screening Tests Health Maintenance  Topic Date Due  . Fecal DNA (Cologuard)  04/17/2020  . MAMMOGRAM  03/28/2021  . INFLUENZA VACCINE  Completed  . DEXA SCAN  Completed  . Hepatitis C Screening  Completed  . PNA vac Low Risk Adult  Completed    Cancer Screenings: Lung: Low Dose CT Chest recommended if Age 44-80 years, 30 pack-year currently smoking OR have quit w/in 15years. Patient does not qualify. Breast:  Up to date on Mammogram? Yes   Up to date  of Bone Density/Dexa? Yes Colorectal:Up to date   Additional Screenings:  Hepatitis C Screening: completed     Plan:  - Need second dose of Shingrix vaccine advised to get vaccine at her pharmacy at least 2-4 weeks after her COVID-19 vaccine. - Due for Tdap vaccine would like to discuss vaccine with Dr.Reed on her upcoming visit in 10/2019.   I have personally reviewed and noted the following in the patient's chart:   . Medical and social history . Use of alcohol, tobacco or illicit drugs  . Current medications and supplements . Functional ability and status . Nutritional status . Physical activity . Advanced directives . List of other physicians . Hospitalizations, surgeries, and ER visits in previous 12 months . Vitals . Screenings to include cognitive, depression, and falls . Referrals and appointments  In addition, I have reviewed and discussed with patient certain preventive protocols, quality metrics, and best practice recommendations. A written personalized care plan for preventive services as well as general preventive health recommendations were provided to patient.    Sandrea Hughs, NP  09/16/2019

## 2019-09-16 NOTE — Progress Notes (Signed)
Patient ID: Sierra Summers, female   DOB: [birth year], 74 y.o.   MRN: [id number] This service is provided via telemedicine  No vital signs collected/recorded due to the encounter was a telemedicine visit.   Location of patient (ex: home, work):  HOME  Patient consents to a telephone visit:  YES  Location of the provider (ex: office, home):  OFFICE  Name of any referring provider:  TIFFANY REED, DO  Names of all persons participating in the telemedicine service and their role in the encounter:  PATIENT, [name], [name], [place], NP  Time spent on call:  6:18

## 2019-09-16 NOTE — Patient Instructions (Addendum)
Sierra Summers , Thank you for taking time to come for your Medicare Wellness Visit. I appreciate your ongoing commitment to your health goals. Please review the following plan we discussed and let me know if I can assist you in the future.   Screening recommendations/referrals: Colonoscopy: Up to date  Mammogram : Up to date  Bone Density : Up to date  Recommended yearly ophthalmology/optometry visit for glaucoma screening and checkup Recommended yearly dental visit for hygiene and checkup  Vaccinations: Influenza vaccine : Up to date  Pneumococcal vaccine : Up to date  Tdap vaccine: due 2021  Shingles vaccine : 2nd dose of shingrix vaccine due.please get vaccine at your pharmacy at least 2-4 weeks after your COVID-19 vaccine.     Advanced directives: yes   Conditions/risks identified: Advance age female > 64 yrs,Hypertension,Hx smoking  Next appointment: 1 year    Preventive Care 74 Years and Older, Female Preventive care refers to lifestyle choices and visits with your health care provider that can promote health and wellness. What does preventive care include?  A yearly physical exam. This is also called an annual well check.  Dental exams once or twice a year.  Routine eye exams. Ask your health care provider how often you should have your eyes checked.  Personal lifestyle choices, including:  Daily care of your teeth and gums.  Regular physical activity.  Eating a healthy diet.  Avoiding tobacco and drug use.  Limiting alcohol use.  Practicing safe sex.  Taking low-dose aspirin every day.  Taking vitamin and mineral supplements as recommended by your health care provider. What happens during an annual well check? The services and screenings done by your health care provider during your annual well check will depend on your age, overall health, lifestyle risk factors, and family history of disease. Counseling  Your health care provider may ask you questions  about your:  Alcohol use.  Tobacco use.  Drug use.  Emotional well-being.  Home and relationship well-being.  Sexual activity.  Eating habits.  History of falls.  Memory and ability to understand (cognition).  Work and work Statistician.  Reproductive health. Screening  You may have the following tests or measurements:  Height, weight, and BMI.  Blood pressure.  Lipid and cholesterol levels. These may be checked every 5 years, or more frequently if you are over 74 years old.  Skin check.  Lung cancer screening. You may have this screening every year starting at age 59 if you have a 30-pack-year history of smoking and currently smoke or have quit within the past 15 years.  Fecal occult blood test (FOBT) of the stool. You may have this test every year starting at age 23.  Flexible sigmoidoscopy or colonoscopy. You may have a sigmoidoscopy every 5 years or a colonoscopy every 10 years starting at age 46.  Hepatitis C blood test.  Hepatitis B blood test.  Sexually transmitted disease (STD) testing.  Diabetes screening. This is done by checking your blood sugar (glucose) after you have not eaten for a while (fasting). You may have this done every 1-3 years.  Bone density scan. This is done to screen for osteoporosis. You may have this done starting at age 40.  Mammogram. This may be done every 1-2 years. Talk to your health care provider about how often you should have regular mammograms. Talk with your health care provider about your test results, treatment options, and if necessary, the need for more tests. Vaccines  Your health care  provider may recommend certain vaccines, such as:  Influenza vaccine. This is recommended every year.  Tetanus, diphtheria, and acellular pertussis (Tdap, Td) vaccine. You may need a Td booster every 10 years.  Zoster vaccine. You may need this after age 57.  Pneumococcal 13-valent conjugate (PCV13) vaccine. One dose is  recommended after age 40.  Pneumococcal polysaccharide (PPSV23) vaccine. One dose is recommended after age 37. Talk to your health care provider about which screenings and vaccines you need and how often you need them. This information is not intended to replace advice given to you by your health care provider. Make sure you discuss any questions you have with your health care provider. Document Released: 07/17/2015 Document Revised: 03/09/2016 Document Reviewed: 04/21/2015 Elsevier Interactive Patient Education  2017 [place] Prevention in the Home Falls can cause injuries. They can happen to people of all ages. There are many things you can do to make your home safe and to help prevent falls. What can I do on the outside of my home?  Regularly fix the edges of walkways and driveways and fix any cracks.  Remove anything that might make you trip as you walk through a door, such as a raised step or threshold.  Trim any bushes or trees on the path to your home.  Use bright outdoor lighting.  Clear any walking paths of anything that might make someone trip, such as rocks or tools.  Regularly check to see if handrails are loose or broken. Make sure that both sides of any steps have handrails.  Any raised decks and porches should have guardrails on the edges.  Have any leaves, snow, or ice cleared regularly.  Use sand or salt on walking paths during winter.  Clean up any spills in your garage right away. This includes oil or grease spills. What can I do in the bathroom?  Use night lights.  Install grab bars by the toilet and in the tub and shower. Do not use towel bars as grab bars.  Use non-skid mats or decals in the tub or shower.  If you need to sit down in the shower, use a plastic, non-slip stool.  Keep the floor dry. Clean up any water that spills on the floor as soon as it happens.  Remove soap buildup in the tub or shower regularly.  Attach bath mats  securely with double-sided non-slip rug tape.  Do not have throw rugs and other things on the floor that can make you trip. What can I do in the bedroom?  Use night lights.  Make sure that you have a light by your bed that is easy to reach.  Do not use any sheets or blankets that are too big for your bed. They should not hang down onto the floor.  Have a firm chair that has side arms. You can use this for support while you get dressed.  Do not have throw rugs and other things on the floor that can make you trip. What can I do in the kitchen?  Clean up any spills right away.  Avoid walking on wet floors.  Keep items that you use a lot in easy-to-reach places.  If you need to reach something above you, use a strong step stool that has a grab bar.  Keep electrical cords out of the way.  Do not use floor polish or wax that makes floors slippery. If you must use wax, use non-skid floor wax.  Do not have throw  rugs and other things on the floor that can make you trip. What can I do with my stairs?  Do not leave any items on the stairs.  Make sure that there are handrails on both sides of the stairs and use them. Fix handrails that are broken or loose. Make sure that handrails are as long as the stairways.  Check any carpeting to make sure that it is firmly attached to the stairs. Fix any carpet that is loose or worn.  Avoid having throw rugs at the top or bottom of the stairs. If you do have throw rugs, attach them to the floor with carpet tape.  Make sure that you have a light switch at the top of the stairs and the bottom of the stairs. If you do not have them, ask someone to add them for you. What else can I do to help prevent falls?  Wear shoes that:  Do not have high heels.  Have rubber bottoms.  Are comfortable and fit you well.  Are closed at the toe. Do not wear sandals.  If you use a stepladder:  Make sure that it is fully opened. Do not climb a closed  stepladder.  Make sure that both sides of the stepladder are locked into place.  Ask someone to hold it for you, if possible.  Clearly mark and make sure that you can see:  Any grab bars or handrails.  First and last steps.  Where the edge of each step is.  Use tools that help you move around (mobility aids) if they are needed. These include:  Canes.  Walkers.  Scooters.  Crutches.  Turn on the lights when you go into a dark area. Replace any light bulbs as soon as they burn out.  Set up your furniture so you have a clear path. Avoid moving your furniture around.  If any of your floors are uneven, fix them.  If there are any pets around you, be aware of where they are.  Review your medicines with your doctor. Some medicines can make you feel dizzy. This can increase your chance of falling. Ask your doctor what other things that you can do to help prevent falls. This information is not intended to replace advice given to you by your health care provider. Make sure you discuss any questions you have with your health care provider. Document Released: 04/16/2009 Document Revised: 11/26/2015 Document Reviewed: 07/25/2014 Elsevier Interactive Patient Education  2017 [employer].

## 2019-10-22 ENCOUNTER — Encounter: Payer: Self-pay | Admitting: Internal Medicine

## 2019-10-22 DIAGNOSIS — E785 Hyperlipidemia, unspecified: Secondary | ICD-10-CM | POA: Diagnosis not present

## 2019-10-22 DIAGNOSIS — R7989 Other specified abnormal findings of blood chemistry: Secondary | ICD-10-CM | POA: Diagnosis not present

## 2019-10-22 DIAGNOSIS — D649 Anemia, unspecified: Secondary | ICD-10-CM | POA: Diagnosis not present

## 2019-10-22 LAB — LIPID PANEL
Cholesterol: 176 (ref 0–200)
HDL: 70 (ref 35–70)
LDL Cholesterol: 87
Triglycerides: 94 (ref 40–160)

## 2019-10-22 LAB — BASIC METABOLIC PANEL
BUN: 24 — AB (ref 4–21)
CO2: 22 (ref 13–22)
Chloride: 105 (ref 99–108)
Creatinine: 1.1 (ref 0.5–1.1)
Glucose: 100
Potassium: 4.8 (ref 3.4–5.3)
Sodium: 140 (ref 137–147)

## 2019-10-22 LAB — COMPREHENSIVE METABOLIC PANEL
Albumin: 5 (ref 3.5–5.0)
Calcium: 10.1 (ref 8.7–10.7)
Globulin: 2.4

## 2019-10-22 LAB — CBC AND DIFFERENTIAL
HCT: 42 (ref 36–46)
Hemoglobin: 14.1 (ref 12.0–16.0)
Platelets: 300 (ref 150–399)
WBC: 4.6

## 2019-10-22 LAB — CBC: RBC: 4.25 (ref 3.87–5.11)

## 2019-10-30 ENCOUNTER — Non-Acute Institutional Stay: Payer: Medicare Other | Admitting: Internal Medicine

## 2019-10-30 ENCOUNTER — Encounter: Payer: Self-pay | Admitting: Internal Medicine

## 2019-10-30 ENCOUNTER — Other Ambulatory Visit: Payer: Self-pay

## 2019-10-30 VITALS — BP 128/80 | HR 92 | Temp 97.9°F | Ht 66.0 in | Wt 157.0 lb

## 2019-10-30 DIAGNOSIS — Z8572 Personal history of non-Hodgkin lymphomas: Secondary | ICD-10-CM | POA: Diagnosis not present

## 2019-10-30 DIAGNOSIS — Z1231 Encounter for screening mammogram for malignant neoplasm of breast: Secondary | ICD-10-CM

## 2019-10-30 DIAGNOSIS — I10 Essential (primary) hypertension: Secondary | ICD-10-CM

## 2019-10-30 DIAGNOSIS — E785 Hyperlipidemia, unspecified: Secondary | ICD-10-CM | POA: Diagnosis not present

## 2019-10-30 DIAGNOSIS — Z9481 Bone marrow transplant status: Secondary | ICD-10-CM | POA: Diagnosis not present

## 2019-10-30 DIAGNOSIS — N1832 Chronic kidney disease, stage 3b: Secondary | ICD-10-CM | POA: Diagnosis not present

## 2019-10-30 DIAGNOSIS — M8589 Other specified disorders of bone density and structure, multiple sites: Secondary | ICD-10-CM

## 2019-10-30 MED ORDER — SIMVASTATIN 40 MG PO TABS: 20.0000 mg | ORAL_TABLET | Freq: Every day | ORAL | 3 refills | Status: DC

## 2019-10-30 MED ORDER — LOSARTAN POTASSIUM 50 MG PO TABS: 50.0000 mg | ORAL_TABLET | Freq: Every day | ORAL | 2 refills | Status: DC

## 2019-10-30 NOTE — Progress Notes (Signed)
Location:  Occupational psychologist of Service:  Clinic (12)  Provider: Tyhesha Dutson L. Mariea Clonts, D.O., C.M.D.  Code Status: DNR Goals of Care:  Advanced Directives 10/30/2019  Does Patient Have a Medical Advance Directive? Yes  Type of Paramedic of [place];Out of facility DNR (pink MOST or yellow form);Living will  Does patient want to make changes to medical advance directive? No - Patient declined  Copy of [place] in Chart? Yes - validated most recent copy scanned in chart (See row information)  Pre-existing out of facility DNR order (yellow form or pink MOST form) Pink MOST/Yellow Form most recent copy in chart - Physician notified to receive inpatient order   Chief Complaint  Patient presents with  . Medical Management of Chronic Issues    4 month follow up     HPI: Patient is a 74 y.o. female seen today for medical management of chronic diseases.    She is living here, but she comes and goes.  She moved from [place].  Labs all in normal range.  Reviewed with her today.   Trying to assimilate here at [facility].  Physically she's doing ok  Plays pickle ball, uses steps instead of elevator.  Tries not to drink too much but does sometimes.    Had a crown taken out in her teeth--has an implant.  Broken blood vessel medial eye.  No headaches or vision changes.  Past Medical History:  Diagnosis Date  . Breast cyst   . Cataract   . Chronic kidney disease, stage 3   . Heart murmur   . Hx of phlebitis 09/28/2016  . 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 Hx of squamous cell carcinoma 09/28/2016  . Hyperlipidemia   . Hypertension   . Non Hodgkin's lymphoma ([place]) 1989  . Osteopenia   . Phlebitis   . Sciatica of left side     Past Surgical History:  Procedure Laterality Date  . BONE MARROW TRANSPLANT  1991  . BREAST CYST EXCISION Right   . Laprascopy  1989 and 1992    Allergies  Allergen Reactions  . Sulfa  Antibiotics Rash    Outpatient Encounter Medications as of 10/30/2019  Medication Sig  . aspirin EC 81 MG tablet Take 81 mg by mouth as needed.   Renard Hamper Pepper-Turmeric (TURMERIC CURCUMIN) 11-998 MG CAPS Take 1 capsule by mouth daily.  . Coenzyme Q10 (CO Q 10) 100 MG CAPS Take 1 capsule by mouth daily.  Marland Kitchen losartan (COZAAR) 50 MG tablet Take 1 tablet (50 mg total) by mouth daily.  Marland Kitchen MELATONIN PO Take 5 mg by mouth at bedtime.  . Multiple Vitamin (MULTIVITAMIN) tablet Take 1 tablet by mouth daily.  . Omega-3 Fatty Acids (OMEGA 3 PO) Take 1,280 mg by mouth daily.  Marland Kitchen OVER THE COUNTER MEDICATION Take 1 capsule by mouth daily. Cordyceps  . OVER THE COUNTER MEDICATION Take 750 mg by mouth daily. Bacopa  . simvastatin (ZOCOR) 40 MG tablet Take 0.5 tablets (20 mg total) by mouth daily at 6 PM.  . [DISCONTINUED] losartan (COZAAR) 50 MG tablet Take 1 tablet (50 mg total) by mouth daily.  . [DISCONTINUED] simvastatin (ZOCOR) 40 MG tablet Take 0.5 tablets (20 mg total) by mouth daily at 6 PM.   No facility-administered encounter medications on file as of 10/30/2019.    Review of Systems:  Review of Systems  Constitutional: Negative for chills, fever and malaise/fatigue.  HENT: Negative  for congestion, hearing loss and sore throat.   Eyes: Positive for redness. Negative for blurred vision.       Glasses  Respiratory: Negative for cough and shortness of breath.   Cardiovascular: Negative for chest pain, palpitations and leg swelling.  Gastrointestinal: Negative for abdominal pain, blood in stool, constipation, diarrhea, heartburn and melena.  Genitourinary: Negative for dysuria.  Musculoskeletal: Negative for falls and joint pain.  Skin: Negative for itching and rash.  Neurological: Negative for loss of consciousness.  Endo/Heme/Allergies: Does not bruise/bleed easily.  Psychiatric/Behavioral: Negative for depression and memory loss. The patient is not nervous/anxious and does not have insomnia.       Health Maintenance  Topic Date Due  . COVID-19 Vaccine (1) Never done  . INFLUENZA VACCINE  02/02/2020  . Fecal DNA (Cologuard)  04/17/2020  . MAMMOGRAM  03/28/2021  . DEXA SCAN  Completed  . Hepatitis C Screening  Completed  . PNA vac Low Risk Adult  Completed    Physical Exam: Vitals:   10/30/19 1520  BP: 128/80  Pulse: 92  Temp: 97.9 F (36.6 C)  SpO2: 98%  Weight: 157 lb (71.2 kg)  Height: 5\' 6"  (1.676 m)   Body mass index is 25.34 kg/m. Physical Exam Vitals reviewed.  Constitutional:      Appearance: Normal appearance.  Eyes:     Comments: Glasses, erythema of medial sclera of left eye  Cardiovascular:     Rate and Rhythm: Normal rate and regular rhythm.     Pulses: Normal pulses.     Heart sounds: Normal heart sounds.  Pulmonary:     Effort: Pulmonary effort is normal.     Breath sounds: Normal breath sounds. No wheezing, rhonchi or rales.  Abdominal:     General: Bowel sounds are normal. There is no distension.     Palpations: Abdomen is soft.     Tenderness: There is no abdominal tenderness.  Musculoskeletal:        General: Normal range of motion.     Right lower leg: No edema.     Left lower leg: No edema.  Skin:    General: Skin is warm and dry.  Neurological:     General: No focal deficit present.     Mental Status: She is alert and oriented to person, place, and time.     Cranial Nerves: No cranial nerve deficit.     Motor: No weakness.     Gait: Gait normal.  Psychiatric:        Mood and Affect: Mood normal.        Behavior: Behavior normal.        Thought Content: Thought content normal.        Judgment: Judgment normal.     Labs reviewed: Basic Metabolic Panel: Recent Labs    02/06/19 0834  NA 137  K 4.9  CL 104  CO2 25  GLUCOSE 104*  BUN 31*  CREATININE 1.27*  CALCIUM 9.3   Liver Function Tests: No results for input(s): AST, ALT, ALKPHOS, BILITOT, PROT, ALBUMIN in the last 8760 hours. No results for input(s): LIPASE,  AMYLASE in the last 8760 hours. No results for input(s): AMMONIA in the last 8760 hours. CBC: No results for input(s): WBC, NEUTROABS, HGB, HCT, MCV, PLT in the last 8760 hours. Lipid Panel: Recent Labs    02/06/19 0834  CHOL 159  HDL 61.50  LDLCALC 83  TRIG 74.0  CHOLHDL 3   No results found for: HGBA1C  Procedures since last visit: No results found.  Assessment/Plan 1. Essential hypertension -bp well controlled, cont same regimen and monitor - losartan (COZAAR) 50 MG tablet; Take 1 tablet (50 mg total) by mouth daily.  Dispense: 90 tablet; Refill: 2  2. Hyperlipidemia, unspecified hyperlipidemia type -lipids at goal (no personal history of CAD or DM and very active) -cont simvastatin (ZOCOR) 40 MG tablet; Take 0.5 tablets (20 mg total) by mouth daily at 6 PM.  Dispense: 45 tablet; Refill: 3  3. Hx of bone marrow transplant ([place]) -monitor cbc carefully  4. History of non-Hodgkin's lymphoma -cont to monitor cbc closely and for any lymphadenopathy or constitutional symptoms  5. Encounter for screening mammogram for malignant neoplasm of breast - due in late sept for annual screening - MM DIGITAL SCREENING BILATERAL; Future  6. Osteopenia of multiple sites - recommended she get her f/u bone density with her mammogram at breast center in late sept -cont weightbearing exercise, should also be taking vitamin D3 - DG Bone Density; Future  7. Stage 3b chronic kidney disease Avoid nephrotoxic agents like nsaids, dose adjust renally excreted meds, hydrate well especially as weather is warming up.  Labs/tests ordered:  No new as just had Next appt:  6 mos for EV/CPE  Damaris Geers L. Jalei Shibley, D.O. [facility] [address], [place] [address] Cell Phone (Mon-Fri 8am-5pm):  [phone] On Call:  [phone] & follow prompts after 5pm & weekends Office Phone:  [phone] Office Fax:  [phone]

## 2020-03-03 DIAGNOSIS — D225 Melanocytic nevi of trunk: Secondary | ICD-10-CM | POA: Diagnosis not present

## 2020-03-03 DIAGNOSIS — L851 Acquired keratosis [keratoderma] palmaris et plantaris: Secondary | ICD-10-CM | POA: Diagnosis not present

## 2020-03-03 DIAGNOSIS — L821 Other seborrheic keratosis: Secondary | ICD-10-CM | POA: Diagnosis not present

## 2020-03-03 DIAGNOSIS — D239 Other benign neoplasm of skin, unspecified: Secondary | ICD-10-CM | POA: Diagnosis not present

## 2020-03-03 DIAGNOSIS — Z85828 Personal history of other malignant neoplasm of skin: Secondary | ICD-10-CM | POA: Diagnosis not present

## 2020-03-30 ENCOUNTER — Other Ambulatory Visit: Payer: Self-pay

## 2020-03-30 ENCOUNTER — Ambulatory Visit
Admission: RE | Admit: 2020-03-30 | Discharge: 2020-03-30 | Disposition: A | Discharge status: Home / Self Care | Payer: Medicare Other | Source: Ambulatory Visit | Attending: Internal Medicine | Admitting: Internal Medicine

## 2020-03-30 DIAGNOSIS — M8589 Other specified disorders of bone density and structure, multiple sites: Secondary | ICD-10-CM

## 2020-03-30 DIAGNOSIS — Z780 Asymptomatic menopausal state: Secondary | ICD-10-CM | POA: Diagnosis not present

## 2020-03-30 DIAGNOSIS — Z1231 Encounter for screening mammogram for malignant neoplasm of breast: Secondary | ICD-10-CM

## 2020-03-30 DIAGNOSIS — M85831 Other specified disorders of bone density and structure, right forearm: Secondary | ICD-10-CM | POA: Diagnosis not present

## 2020-03-30 NOTE — Progress Notes (Signed)
Bone density was consistent with osteopenia.  I recommend she take Vitamin D3 2000 units daily for bone strength, continue her weightbearing exercise with pickle ball.  Adding light weights and some form of balance exercise would be great.

## 2020-03-31 ENCOUNTER — Other Ambulatory Visit: Payer: Self-pay

## 2020-03-31 MED ORDER — D3 50 MCG (2000 UT) PO TABS: 2000.0000 [IU] | ORAL_TABLET | Freq: Every day | ORAL | 11 refills | Status: DC

## 2020-04-01 NOTE — Progress Notes (Signed)
Normal mammogram

## 2020-04-15 ENCOUNTER — Other Ambulatory Visit: Payer: Self-pay

## 2020-04-15 ENCOUNTER — Non-Acute Institutional Stay: Payer: Medicare Other | Admitting: Internal Medicine

## 2020-04-15 ENCOUNTER — Encounter: Payer: Self-pay | Admitting: Internal Medicine

## 2020-04-15 VITALS — BP 132/82 | HR 81 | Temp 97.7°F | Ht 66.0 in | Wt 157.4 lb

## 2020-04-15 DIAGNOSIS — N1832 Chronic kidney disease, stage 3b: Secondary | ICD-10-CM

## 2020-04-15 DIAGNOSIS — F5104 Psychophysiologic insomnia: Secondary | ICD-10-CM | POA: Diagnosis not present

## 2020-04-15 DIAGNOSIS — M8589 Other specified disorders of bone density and structure, multiple sites: Secondary | ICD-10-CM | POA: Diagnosis not present

## 2020-04-15 DIAGNOSIS — I10 Essential (primary) hypertension: Secondary | ICD-10-CM

## 2020-04-15 DIAGNOSIS — E785 Hyperlipidemia, unspecified: Secondary | ICD-10-CM

## 2020-04-15 MED ORDER — SIMVASTATIN 40 MG PO TABS: 20.0000 mg | ORAL_TABLET | Freq: Every day | ORAL | 3 refills | Status: DC

## 2020-04-15 MED ORDER — LOSARTAN POTASSIUM 50 MG PO TABS: 50.0000 mg | ORAL_TABLET | Freq: Every day | ORAL | 3 refills | Status: DC

## 2020-04-15 NOTE — Progress Notes (Signed)
Provider:  Rexene Edison. Mariea Clonts, D.O., C.M.D. Location:  Occupational psychologist of Service:  Clinic (12)  Previous PCP: Gayland Curry, DO Patient Care Team: Gayland Curry, DO as PCP - General (Geriatric Medicine) Otelia Sergeant, OD as Referring Physician Jari Pigg, MD as Consulting Physician (Dermatology)  Extended Emergency Contact Information Primary Emergency Contact: Rau,Martha Address: [address]          [place], [place] [address] Johnnette Litter of [place] Phone: [phone] Mobile Phone: [phone] Relation: Sister  Code Status: DNR Goals of Care: Advanced Directive information Advanced Directives 04/15/2020  Does Patient Have a Medical Advance Directive? Yes  Type of Advance Directive Out of facility DNR (pink MOST or yellow form)  Does patient want to make changes to medical advance directive? No - Patient declined  Copy of [place] in Chart? -  Pre-existing out of facility DNR order (yellow form or pink MOST form) -   Chief Complaint  Patient presents with  . Medical Management of Chronic Issues    Extended Visit   . Acute Visit    Recomendation on sleep medications   . Health Maintenance    Influenza     HPI: Patient is a 74 y.o. female seen today for an extended visit for her physical exam.  She was in a 30 min slot anyway.    Flu shots will be given at [place].  She does not sleep well at night and requests options for managing this.  She started going to see [facility] for counseling.  In June, a friend of hers committed suicide.  She looked at all of the gray heads in the theatre.  Says gray fog settled over here.  She was not getting through it alone so she contacted the counselor.  She's not isolating.  She's exercising.  Her social activity is not different than baseline. She's had a new development in her family in the past couple of weeks.  She occasionally takes melatonin for sleep and  she typically sleeps 6hrs.  No she cannot get past 4 hrs.  Wants to know if she should try melatonin every night not just prn.  Asks about otc or Rx sleeping aids.  She's going to try going to 10mg  at night each night.    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    Feet ache some with thinning tissues and arthritis.    She's got to up her vitamin D to 2000 units just has not yet.  She may be taking 1000 units daily.  Osteopnea with T score -1.5.    She just had her second shingrix.    She wants to get her moderna booster.    Also wants a flu shot.    Past Medical History:  Diagnosis Date  . Breast cyst   . Cataract   . Chronic kidney disease, stage 3 ([place])   . Heart murmur   . Hx of phlebitis 09/28/2016  . 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 Hx of squamous cell carcinoma 09/28/2016  . Hyperlipidemia   . Hypertension   . 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 Osteopenia   . Phlebitis   . Sciatica of left side    Past Surgical History:  Procedure Laterality Date  . BONE MARROW TRANSPLANT  1991  .  BREAST CYST EXCISION Right   . [place]    reports that she quit smoking about 36 years ago. Her smoking use included cigarettes. She quit after 10.00 years of use. She has never used smokeless tobacco. She reports current alcohol use. She reports that she does not use drugs.  Functional Status Survey:    Family History  Problem Relation Age of Onset  . Heart disease Mother   . Hyperlipidemia Mother   . Hypertension Mother   . Stroke Mother   . Diabetes Father   . Heart disease Father   . Hyperlipidemia Father   . Mental illness Father   . Alcohol abuse Father   . Hyperlipidemia Sister   . Hypertension Sister   . Cancer Maternal Grandmother   . Heart disease Maternal Grandfather   . Mental illness Paternal Grandmother   . Mental illness  Paternal Grandfather   . Heart attack Sister   . Stroke Sister   . Dementia Sister   . High blood pressure Sister   . High Cholesterol Sister   . Parkinson's disease Sister     Health Maintenance  Topic Date Due  . INFLUENZA VACCINE  02/02/2020  . Fecal DNA (Cologuard)  04/17/2020  . MAMMOGRAM  03/30/2022  . DEXA SCAN  Completed  . COVID-19 Vaccine  Completed  . Hepatitis C Screening  Completed  . PNA vac Low Risk Adult  Completed    Allergies  Allergen Reactions  . Sulfa Antibiotics Rash    Outpatient Encounter Medications as of 04/15/2020  Medication Sig  . aspirin EC 81 MG tablet Take 81 mg by mouth as needed.   Renard Hamper Pepper-Turmeric (TURMERIC CURCUMIN) 11-998 MG CAPS Take 1 capsule by mouth daily.  . Cholecalciferol (D3) 50 MCG (2000 UT) TABS Take 1 tablet (2,000 Units total) by mouth daily.  . Coenzyme Q10 (CO Q 10) 100 MG CAPS Take 1 capsule by mouth daily.  Marland Kitchen losartan (COZAAR) 50 MG tablet Take 1 tablet (50 mg total) by mouth daily.  Marland Kitchen MELATONIN PO Take 5 mg by mouth at bedtime.  . Multiple Vitamin (MULTIVITAMIN) tablet Take 1 tablet by mouth daily.  . Omega-3 Fatty Acids (OMEGA 3 PO) Take 1,280 mg by mouth daily.  Marland Kitchen OVER THE COUNTER MEDICATION Take 1 capsule by mouth daily. Cordyceps  . OVER THE COUNTER MEDICATION Take 750 mg by mouth daily. Bacopa  . simvastatin (ZOCOR) 40 MG tablet Take 0.5 tablets (20 mg total) by mouth daily at 6 PM.  . [DISCONTINUED] losartan (COZAAR) 50 MG tablet Take 1 tablet (50 mg total) by mouth daily.  . [DISCONTINUED] simvastatin (ZOCOR) 40 MG tablet Take 0.5 tablets (20 mg total) by mouth daily at 6 PM.   No facility-administered encounter medications on file as of 04/15/2020.    Review of Systems  Constitutional: Negative for chills, fever and malaise/fatigue.  HENT: Negative for congestion, hearing loss and sore throat.   Eyes: Negative for blurred vision.  Respiratory: Negative for cough and shortness of breath.     Cardiovascular: Negative for chest pain, palpitations and leg swelling.  Gastrointestinal: Negative for abdominal pain, blood in stool, constipation, diarrhea and melena.  Genitourinary: Negative for dysuria.  Musculoskeletal: Negative for falls and joint pain.       Soreness of feet with all of her exercise  Skin: Negative for itching and rash.  Neurological: Negative for dizziness, loss of consciousness and headaches.  Endo/Heme/Allergies: Does not bruise/bleed easily.  Psychiatric/Behavioral: Negative  for depression, hallucinations and memory loss. The patient has insomnia. The patient is not nervous/anxious.     Vitals:   04/15/20 1326  BP: 132/82  Pulse: 81  Temp: 97.7 F (36.5 C)  SpO2: 97%  Weight: 157 lb 6.4 oz (71.4 kg)  Height: 5\' 6"  (1.676 m)   Body mass index is 25.41 kg/m. Physical Exam Vitals reviewed.  Constitutional:      General: She is not in acute distress.    Appearance: Normal appearance. She is not ill-appearing or toxic-appearing.  HENT:     Head: Normocephalic and atraumatic.     Right Ear: Tympanic membrane, ear canal and external ear normal.     Left Ear: Tympanic membrane, ear canal and external ear normal.     Nose: Nose normal.     Mouth/Throat:     Pharynx: Oropharynx is clear.  Eyes:     Extraocular Movements: Extraocular movements intact.     Conjunctiva/sclera: Conjunctivae normal.     Pupils: Pupils are equal, round, and reactive to light.  Cardiovascular:     Rate and Rhythm: Normal rate and regular rhythm.     Pulses: Normal pulses.     Heart sounds: Normal heart sounds. No murmur heard.  No friction rub. No gallop.   Pulmonary:     Effort: Pulmonary effort is normal.     Breath sounds: Normal breath sounds. No wheezing, rhonchi or rales.  Chest:     Chest wall: No mass, lacerations, deformity, swelling, tenderness, crepitus or edema. There is no dullness to percussion.     Breasts:        Right: Normal. No inverted nipple, mass,  nipple discharge, skin change or tenderness.        Left: Normal. No inverted nipple, mass, nipple discharge, skin change or tenderness.  Abdominal:     General: Bowel sounds are normal. There is no distension.     Tenderness: There is no abdominal tenderness. There is no guarding or rebound.  Musculoskeletal:        General: Normal range of motion.     Cervical back: Neck supple.     Right lower leg: No edema.     Left lower leg: No edema.  Lymphadenopathy:     Upper Body:     Right upper body: No supraclavicular, axillary or pectoral adenopathy.     Left upper body: No supraclavicular, axillary or pectoral adenopathy.  Skin:    General: Skin is warm and dry.  Neurological:     General: No focal deficit present.     Mental Status: She is alert and oriented to person, place, and time.     Cranial Nerves: No cranial nerve deficit.     Sensory: No sensory deficit.     Motor: No weakness.     Coordination: Coordination normal.     Gait: Gait normal.     Comments: Could not obtain patellar DTRs  Psychiatric:        Mood and Affect: Mood normal.        Behavior: Behavior normal.        Thought Content: Thought content normal.        Judgment: Judgment normal.     Labs reviewed: Basic Metabolic Panel: Recent Labs    10/22/19 1235  NA 140  K 4.8  CL 105  CO2 22  BUN 24*  CREATININE 1.1  CALCIUM 10.1   Liver Function Tests: Recent Labs    10/22/19 1235  ALBUMIN 5.0   No results for input(s): LIPASE, AMYLASE in the last 8760 hours. No results for input(s): AMMONIA in the last 8760 hours. CBC: Recent Labs    10/22/19 1235  WBC 4.6  HGB 14.1  HCT 42  PLT 300   Cardiac Enzymes: No results for input(s): CKTOTAL, CKMB, CKMBINDEX, TROPONINI in the last 8760 hours. BNP: Invalid input(s): POCBNP No results found for: HGBA1C No results found for: TSH Lab Results  Component Value Date   VITAMINB12 388 01/31/2014   No results found for: FOLATE No results found  for: IRON, TIBC, FERRITIN  Imaging and Procedures Recently: Bone density with osteopenia and mammogram reviewed  Assessment/Plan 1. Essential hypertension -bp controlled with current regimen and exercise routine - losartan (COZAAR) 50 MG tablet; Take 1 tablet (50 mg total) by mouth daily.  Dispense: 90 tablet; Refill: 3  2. Hyperlipidemia, unspecified hyperlipidemia type - last LDL was less than 100, f/u before next visit in 6 mos - simvastatin (ZOCOR) 40 MG tablet; Take 0.5 tablets (20 mg total) by mouth daily at 6 PM.  Dispense: 45 tablet; Refill: 3  3. Osteopenia of multiple sites -increase vitamin D3 to 2000 units daily -continue weightbearing exercise with pickleball,hiking and walking  4. Stage 3b chronic kidney disease ([place]) -stable last check in April and no changes with her physically since, recheck before next visit  5. Psychophysiological insomnia -may use 10mg  melatonin if needed each night until she gets into a better sleeping groove   Labs/tests ordered:  Cbc with diff, cmp, flp before 6 mos f/u  Aiyah Scarpelli L. Margrit Minner, D.O. [facility] [address], [place] [address] Cell Phone (Mon-Fri 8am-5pm):  [phone] On Call:  [phone] & follow prompts after 5pm & weekends Office Phone:  [phone] Office Fax:  [phone]

## 2020-04-27 DIAGNOSIS — Z23 Encounter for immunization: Secondary | ICD-10-CM | POA: Diagnosis not present

## 2020-04-29 ENCOUNTER — Telehealth: Payer: Self-pay | Admitting: *Deleted

## 2020-04-29 DIAGNOSIS — F5104 Psychophysiologic insomnia: Secondary | ICD-10-CM

## 2020-04-29 MED ORDER — TRAZODONE HCL 50 MG PO TABS: 50.0000 mg | ORAL_TABLET | Freq: Every day | ORAL | 3 refills | Status: DC

## 2020-04-29 NOTE — Telephone Encounter (Signed)
Patient called and stated she is interested in taking the medication you and her talked about at last visit that you couldn't remember the name for. Stated that it  works with Insomnia and Anxiety.  Patient stated that the Melatonin is not working and she wants to try this mild medication instead but doesn't know the name.   Please Advise  Pharmacy: [facility]

## 2020-04-29 NOTE — Telephone Encounter (Signed)
Ok, stop melatonin.  Start trazodone 50mg  at bedtime.  Rx sent to [facility].

## 2020-04-30 NOTE — Telephone Encounter (Signed)
Patient notified

## 2020-05-08 DIAGNOSIS — M79671 Pain in right foot: Secondary | ICD-10-CM | POA: Diagnosis not present

## 2020-05-08 DIAGNOSIS — M79674 Pain in right toe(s): Secondary | ICD-10-CM | POA: Diagnosis not present

## 2020-05-08 DIAGNOSIS — M7741 Metatarsalgia, right foot: Secondary | ICD-10-CM | POA: Diagnosis not present

## 2020-05-08 DIAGNOSIS — L600 Ingrowing nail: Secondary | ICD-10-CM | POA: Diagnosis not present

## 2020-05-11 DIAGNOSIS — Z7184 Encounter for health counseling related to travel: Secondary | ICD-10-CM | POA: Diagnosis not present

## 2020-05-26 DIAGNOSIS — M79674 Pain in right toe(s): Secondary | ICD-10-CM | POA: Diagnosis not present

## 2020-05-26 DIAGNOSIS — L600 Ingrowing nail: Secondary | ICD-10-CM | POA: Diagnosis not present

## 2020-06-09 DIAGNOSIS — L600 Ingrowing nail: Secondary | ICD-10-CM | POA: Diagnosis not present

## 2020-06-18 DIAGNOSIS — H2513 Age-related nuclear cataract, bilateral: Secondary | ICD-10-CM | POA: Diagnosis not present

## 2020-07-23 ENCOUNTER — Other Ambulatory Visit: Payer: Self-pay | Admitting: Internal Medicine

## 2020-07-23 DIAGNOSIS — I10 Essential (primary) hypertension: Secondary | ICD-10-CM

## 2020-08-24 ENCOUNTER — Encounter: Payer: Self-pay | Admitting: Internal Medicine

## 2020-09-17 ENCOUNTER — Other Ambulatory Visit: Payer: Self-pay

## 2020-09-17 ENCOUNTER — Telehealth: Payer: Self-pay

## 2020-09-17 ENCOUNTER — Encounter: Payer: Self-pay | Admitting: Family

## 2020-09-17 ENCOUNTER — Ambulatory Visit (INDEPENDENT_AMBULATORY_CARE_PROVIDER_SITE_OTHER): Payer: Medicare Other | Admitting: Family

## 2020-09-17 DIAGNOSIS — Z0000 Encounter for general adult medical examination without abnormal findings: Secondary | ICD-10-CM | POA: Diagnosis not present

## 2020-09-17 NOTE — Progress Notes (Signed)
This service is provided via telemedicine  No vital signs collected/recorded due to the encounter was a telemedicine visit.   Location of patient (ex: home, work):  Home  Patient consents to a telephone visit:  Yes see telephone note 09/17/2020  Location of the provider (ex: office, home): [facility]  Name of any referring provider: N/A  Names of all persons participating in the telemedicine service and their role in the encounter: Marisa Cyphers RMA, Webb Silversmith Khayden Herzberg NP, Patient   Time spent on call:  10 min     Subjective:   Sierra Summers is a 75 y.o. female who presents for Medicare Annual (Subsequent) preventive examination.  Review of Systems     Cardiac Risk Factors include: dyslipidemia;advanced age (>58men, >75 women);hypertension;smoking/ tobacco exposure     Objective:    There were no vitals filed for this visit. There is no height or weight on file to calculate BMI.  Advanced Directives 09/17/2020 04/15/2020 10/30/2019 09/16/2019 06/26/2019 03/28/2018 03/20/2017  Does Patient Have a Medical Advance Directive? Yes Yes Yes Yes Yes Yes Yes  Type of Paramedic of [place];Living will;Out of facility DNR (pink MOST or yellow form) Out of facility DNR (pink MOST or yellow form) [place];Out of facility DNR (pink MOST or yellow form);Living will [place];Living will;Out of facility DNR (pink MOST or yellow form) [place];Living will;Out of facility DNR (pink MOST or yellow form) [place];Living will [place];Living will  Does patient want to make changes to medical advance directive? No - Patient declined No - Patient declined No - Patient declined No - Patient declined No - Patient declined No - Patient declined No - Patient declined  Copy of [place] in Chart? Yes - validated most recent copy scanned in  chart (See row information) - Yes - validated most recent copy scanned in chart (See row information) Yes - validated most recent copy scanned in chart (See row information) Yes - validated most recent copy scanned in chart (See row information) Yes Yes  Pre-existing out of facility DNR order (yellow form or pink MOST form) Yellow form placed in chart (order not valid for inpatient use) - Pink MOST/Yellow Form most recent copy in chart - Physician notified to receive inpatient order - Yellow form placed in chart (order not valid for inpatient use) - -    Current Medications (verified) Outpatient Encounter Medications as of 09/17/2020  Medication Sig  . aspirin EC 81 MG tablet Take 81 mg by mouth as needed.   Renard Hamper Pepper-Turmeric (TURMERIC CURCUMIN) 11-998 MG CAPS Take 1 capsule by mouth daily.  . Cholecalciferol (D3) 50 MCG (2000 UT) TABS Take 1 tablet (2,000 Units total) by mouth daily.  . Coenzyme Q10 (CO Q 10) 100 MG CAPS Take 1 capsule by mouth daily.  Marland Kitchen losartan (COZAAR) 50 MG tablet Take 1 tablet (50 mg total) by mouth daily.  . Multiple Vitamin (MULTIVITAMIN) tablet Take 1 tablet by mouth daily.  . Omega-3 Fatty Acids (OMEGA 3 PO) Take 1,280 mg by mouth daily.  Marland Kitchen OVER THE COUNTER MEDICATION Take 1 capsule by mouth daily. Cordyceps  . OVER THE COUNTER MEDICATION Take 750 mg by mouth daily. Bacopa  . simvastatin (ZOCOR) 40 MG tablet Take 0.5 tablets (20 mg total) by mouth daily at 6 PM.  . traZODone (DESYREL) 50 MG tablet Take 1 tablet (50 mg total) by mouth  at bedtime.  . [DISCONTINUED] MELATONIN PO Take 5 mg by mouth at bedtime.   No facility-administered encounter medications on file as of 09/17/2020.    Allergies (verified) Sulfa antibiotics   History: Past Medical History:  Diagnosis Date  . Breast cyst   . Cataract   . Chronic kidney disease, stage 3 ([place])   . Heart murmur   . Hx of phlebitis 09/28/2016  . 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 Hx of squamous cell  carcinoma 09/28/2016  . Hyperlipidemia   . Hypertension   . 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 Osteopenia   . Phlebitis   . Sciatica of left side    Past Surgical History:  Procedure Laterality Date  . BONE MARROW TRANSPLANT  1991  . BREAST CYST EXCISION Right   . Laprascopy  1989 and 1992   Family History  Problem Relation Age of Onset  . Heart disease Mother   . Hyperlipidemia Mother   . Hypertension Mother   . Stroke Mother   . Diabetes Father   . Heart disease Father   . Hyperlipidemia Father   . Mental illness Father   . Alcohol abuse Father   . Hyperlipidemia Sister   . Hypertension Sister   . Cancer Maternal Grandmother   . Heart disease Maternal Grandfather   . Mental illness Paternal Grandmother   . Mental illness Paternal Grandfather   . Heart attack Sister   . Stroke Sister   . Dementia Sister   . High blood pressure Sister   . High Cholesterol Sister   . Parkinson's disease Sister    Social History   Socioeconomic History  . Marital status: Divorced    Spouse name: Not on file  . Number of children: Not on file  . Years of education: Not on file  . Highest education level: Not on file  Occupational History    Employer: [employer]  Tobacco Use  . Smoking status: Former Smoker    Years: 10.00    Types: Cigarettes    Quit date: 07/05/1983    Years since quitting: 37.2  . Smokeless tobacco: Never Used  . Tobacco comment: Pt states she was a binge smoker  Vaping Use  . Vaping Use: Never used  Substance and Sexual Activity  . Alcohol use: Yes    Comment: 5-6 drinks a week  . Drug use: No  . Sexual activity: Not Currently    Partners: Male  Other Topics Concern  . Not on file  Social History Narrative   Social History      Diet? n/a      Do you drink/eat things with caffeine? Mostly no       Marital status?                        n/a            What year were you married?      Do you live in a house, apartment, assisted living, condo,  trailer, etc.? apartment      Is it one or more stories? 3       How many persons live in your home? 1      Do you have any pets in your home? no      Highest level of education completed? Some college       Current or past profession: flight attendant      Do you  exercise?                 yes                     Type & how often? Every day       Advanced Directives      Do you have a living will? yes      Do you have a DNR form?                                  If not, do you want to discuss one? Yes      Do you have signed POA/HPOA for forms? yes      Functional Status      Do you have difficulty bathing or dressing yourself? No       Do you have difficulty preparing food or eating? No       Do you have difficulty managing your medications? No       Do you have difficulty managing your finances? No       Do you have difficulty affording your medications? No       Social Determinants of Radio broadcast assistant Strain: Not on file  Food Insecurity: Not on file  Transportation Needs: Not on file  Physical Activity: Not on file  Stress: Not on file  Social Connections: Not on file    Tobacco Counseling Counseling given: Not Answered Comment: Pt states she was a binge smoker   Clinical Intake:  Pre-visit preparation completed: No  Pain : No/denies pain     BMI - recorded: 25.41 Nutritional Status: BMI 25 -29 Overweight Nutritional Risks: None Diabetes: No  How often do you need to have someone help you when you read instructions, pamphlets, or other written materials from your doctor or pharmacy?: 1 - Never What is the last grade level you completed in school?: Some College  Diabetic?Yes   Interpreter Needed?: No  Information entered by :: Naithen Rivenburg,FNP-C   Activities of Daily Living In your present state of health, do you have any difficulty performing the following activities: 09/17/2020  Hearing? N  Vision? N  Difficulty  concentrating or making decisions? N  Walking or climbing stairs? N  Dressing or bathing? N  Doing errands, shopping? N  Preparing Food and eating ? N  Using the Toilet? N  In the past six months, have you accidently leaked urine? N  Do you have problems with loss of bowel control? N  Managing your Medications? N  Managing your Finances? N  Housekeeping or managing your Housekeeping? N  Some recent data might be hidden    Patient Care Team: Gayland Curry, DO as PCP - General (Geriatric Medicine) Otelia Sergeant, OD as Referring Physician Jari Pigg, MD as Consulting Physician (Dermatology)  Indicate any recent Medical Services you may have received from other than [facility] providers in the past year (date may be approximate).     Assessment:   This is a routine wellness examination for [facility].  Hearing/Vision screen No exam data present  Dietary issues and exercise activities discussed: Current Exercise Habits: Home exercise routine, Type of exercise: yoga;walking, Time (Minutes): 60, Frequency (Times/Week): 3, Weekly Exercise (Minutes/Week): 180, Intensity: Moderate, Exercise limited by: None identified  Goals    . Exercise 150 min/wk Moderate Activity     Continue to stay active with yoga  and pickle ball      Depression Screen PHQ 2/9 Scores 09/17/2020 10/30/2019 09/16/2019 06/26/2019 04/09/2019 08/08/2018 03/28/2018  PHQ - 2 Score 0 0 0 0 0 0 0  PHQ- 9 Score - - - - 2 0 -    Fall Risk Fall Risk  09/17/2020 04/15/2020 10/30/2019 09/16/2019 06/26/2019  Falls in the past year? 0 0 0 0 0  Number falls in past yr: 0 0 0 0 0  Injury with Fall? 0 0 0 0 0    FALL RISK PREVENTION PERTAINING TO THE HOME:  Any stairs in or around the home? Yes  If so, are there any without handrails? Yes  Home free of loose throw rugs in walkways, pet beds, electrical cords, etc? No  Adequate lighting in your home to reduce risk of falls? Yes   ASSISTIVE DEVICES UTILIZED TO PREVENT  FALLS:  Life alert? No  Use of a cane, walker or w/c? No  Grab bars in the bathroom? Yes  Shower chair or bench in shower? Yes  Elevated toilet seat or a handicapped toilet? No   TIMED UP AND GO:  Was the test performed? No .  Length of time to ambulate 10 feet: N/A  sec.   Gait steady and fast with assistive device  Cognitive Function:     6CIT Screen 09/17/2020 09/16/2019 03/28/2018  What Year? 0 points 0 points 0 points  What month? 0 points 0 points 0 points  What time? 0 points 0 points 0 points  Count back from 20 0 points 0 points 0 points  Months in reverse 0 points 0 points 0 points  Repeat phrase 0 points 0 points 0 points  Total Score 0 0 0    Immunizations Immunization History  Administered Date(s) Administered  . Fluad Quad(high Dose 65+) 04/15/2019  . Influenza, High Dose Seasonal PF 03/20/2017, 03/28/2018  . Moderna Sars-Covid-2 Vaccination 07/14/2019, 08/13/2019, 04/27/2020  . Pneumococcal Conjugate-13 01/31/2014  . Pneumococcal Polysaccharide-23 01/19/2011  . Tdap 05/02/2007  . Zoster 01/17/2020  . Zoster Recombinat (Shingrix) 10/26/2007    TDAP status: Due, Education has been provided regarding the importance of this vaccine. Advised may receive this vaccine at local pharmacy or Health Dept. Aware to provide a copy of the vaccination record if obtained from local pharmacy or Health Dept. Verbalized acceptance and understanding.  Flu Vaccine status: Due, Education has been provided regarding the importance of this vaccine. Advised may receive this vaccine at local pharmacy or Health Dept. Aware to provide a copy of the vaccination record if obtained from local pharmacy or Health Dept. Verbalized acceptance and understanding.  Pneumococcal vaccine status: Due, Education has been provided regarding the importance of this vaccine. Advised may receive this vaccine at local pharmacy or Health Dept. Aware to provide a copy of the vaccination record if obtained  from local pharmacy or Health Dept. Verbalized acceptance and understanding.  Covid-19 vaccine status: Completed vaccines  Qualifies for Shingles Vaccine? Yes   Zostavax completed Yes   Shingrix Completed?: Yes  Screening Tests Health Maintenance  Topic Date Due  . INFLUENZA VACCINE  02/02/2020  . Fecal DNA (Cologuard)  04/17/2020  . COVID-19 Vaccine (4 - Booster for Moderna series) 10/26/2020  . DEXA SCAN  Completed  . Hepatitis C Screening  Completed  . PNA vac Low Risk Adult  Completed  . HPV VACCINES  Aged Out    Health Maintenance  Health Maintenance Due  Topic Date Due  . INFLUENZA VACCINE  02/02/2020  .  Fecal DNA (Cologuard)  04/17/2020    Colorectal cancer screening: No longer required.   Mammogram status: Completed 03/30/2020 . Repeat every year  Bone Density status: Completed 03/30/2020 . Results reflect: Bone density results: OSTEOPENIA. Repeat every 2 years.  Lung Cancer Screening: (Low Dose CT Chest recommended if Age 63-80 years, 30 pack-year currently smoking OR have quit w/in 15years.) does not qualify.   Lung Cancer Screening Referral: No   Additional Screening:  Hepatitis C Screening: does not qualify; Completed does  Vision Screening: Recommended annual ophthalmology exams for early detection of glaucoma and other disorders of the eye. Is the patient up to date with their annual eye exam?  Yes  Who is the provider or what is the name of the office in which the patient attends annual eye exams? Dr. Quay Burow  If pt is not established with a provider, would they like to be referred to a provider to establish care? No .   Dental Screening: Recommended annual dental exams for proper oral hygiene  Community Resource Referral / Chronic Care Management: CRR required this visit?  No   CCM required this visit?  No      Plan:   - Tdap vaccine  - PNA vac 23   I have personally reviewed and noted the following in the patient's chart:   . Medical and  social history . Use of alcohol, tobacco or illicit drugs  . Current medications and supplements . Functional ability and status . Nutritional status . Physical activity . Advanced directives . List of other physicians . Hospitalizations, surgeries, and ER visits in previous 12 months . Vitals . Screenings to include cognitive, depression, and falls . Referrals and appointments  In addition, I have reviewed and discussed with patient certain preventive protocols, quality metrics, and best practice recommendations. A written personalized care plan for preventive services as well as general preventive health recommendations were provided to patient.   Sandrea Hughs, NP   09/17/2020   Nurse Notes:Due for Tdap and PNA vac 23 would like to wait on this.Advised to discuss with Dr.Reed on upcoming visit.

## 2020-09-17 NOTE — Telephone Encounter (Signed)
Ms. Sierra Summers, Sierra Summers are scheduled for a virtual visit with your provider today.    Just as we do with appointments in the office, we must obtain your consent to participate.  Your consent will be active for this visit and any virtual visit you may have with one of our providers in the next 365 days.    If you have a MyChart account, I can also send a copy of this consent to you electronically.  All virtual visits are billed to your insurance company just like a traditional visit in the office.  As this is a virtual visit, video technology does not allow for your provider to perform a traditional examination.  This may limit your provider's ability to fully assess your condition.  If your provider identifies any concerns that need to be evaluated in person or the need to arrange testing such as labs, EKG, etc, we will make arrangements to do so.    Although advances in technology are sophisticated, we cannot ensure that it will always work on either your end or our end.  If the connection with a video visit is poor, we may have to switch to a telephone visit.  With either a video or telephone visit, we are not always able to ensure that we have a secure connection.   I need to obtain your verbal consent now.   Are you willing to proceed with your visit today?   Sierra Summers [facility] has provided verbal consent on 09/17/2020 for a virtual visit (video or telephone).   Sierra Summers, [place] 09/17/2020  7:52 AM

## 2020-09-17 NOTE — Patient Instructions (Signed)
Sierra Summers , Thank you for taking time to come for your Medicare Wellness Visit. I appreciate your ongoing commitment to your health goals. Please review the following plan we discussed and let me know if I can assist you in the future.   Screening recommendations/referrals: Colonoscopy: Up to date  Mammogram : Up to date  Bone Density : Up to date  Recommended yearly ophthalmology/optometry visit for glaucoma screening and checkup Recommended yearly dental visit for hygiene and checkup  Vaccinations: Influenza vaccine: Annually  Pneumococcal vaccine : Pneumococcal vaccine 23 second dose due.  Tdap vaccine: Due  Shingles vaccine: Up to date   Advanced directives: Yes   Conditions/risks identified: Advance age female > 75 yrs,Hypertension,dyslipidmia,Hx of smoking   Next appointment:1 year     Preventive Care 87 Years and Older, Female Preventive care refers to lifestyle choices and visits with your health care provider that can promote health and wellness. What does preventive care include?  A yearly physical exam. This is also called an annual well check.  Dental exams once or twice a year.  Routine eye exams. Ask your health care provider how often you should have your eyes checked.  Personal lifestyle choices, including:  Daily care of your teeth and gums.  Regular physical activity.  Eating a healthy diet.  Avoiding tobacco and drug use.  Limiting alcohol use.  Practicing safe sex.  Taking low-dose aspirin every day.  Taking vitamin and mineral supplements as recommended by your health care provider. What happens during an annual well check? The services and screenings done by your health care provider during your annual well check will depend on your age, overall health, lifestyle risk factors, and family history of disease. Counseling  Your health care provider may ask you questions about your:  Alcohol use.  Tobacco use.  Drug use.  Emotional  well-being.  Home and relationship well-being.  Sexual activity.  Eating habits.  History of falls.  Memory and ability to understand (cognition).  Work and work Statistician.  Reproductive health. Screening  You may have the following tests or measurements:  Height, weight, and BMI.  Blood pressure.  Lipid and cholesterol levels. These may be checked every 5 years, or more frequently if you are over 5 years old.  Skin check.  Lung cancer screening. You may have this screening every year starting at age 44 if you have a 30-pack-year history of smoking and currently smoke or have quit within the past 15 years.  Fecal occult blood test (FOBT) of the stool. You may have this test every year starting at age 57.  Flexible sigmoidoscopy or colonoscopy. You may have a sigmoidoscopy every 5 years or a colonoscopy every 10 years starting at age 72.  Hepatitis C blood test.  Hepatitis B blood test.  Sexually transmitted disease (STD) testing.  Diabetes screening. This is done by checking your blood sugar (glucose) after you have not eaten for a while (fasting). You may have this done every 1-3 years.  Bone density scan. This is done to screen for osteoporosis. You may have this done starting at age 25.  Mammogram. This may be done every 1-2 years. Talk to your health care provider about how often you should have regular mammograms. Talk with your health care provider about your test results, treatment options, and if necessary, the need for more tests. Vaccines  Your health care provider may recommend certain vaccines, such as:  Influenza vaccine. This is recommended every year.  Tetanus, diphtheria,  and acellular pertussis (Tdap, Td) vaccine. You may need a Td booster every 10 years.  Zoster vaccine. You may need this after age 80.  Pneumococcal 13-valent conjugate (PCV13) vaccine. One dose is recommended after age 51.  Pneumococcal polysaccharide (PPSV23) vaccine. One  dose is recommended after age 72. Talk to your health care provider about which screenings and vaccines you need and how often you need them. This information is not intended to replace advice given to you by your health care provider. Make sure you discuss any questions you have with your health care provider. Document Released: 07/17/2015 Document Revised: 03/09/2016 Document Reviewed: 04/21/2015 Elsevier Interactive Patient Education  2017 [place] Prevention in the Home Falls can cause injuries. They can happen to people of all ages. There are many things you can do to make your home safe and to help prevent falls. What can I do on the outside of my home?  Regularly fix the edges of walkways and driveways and fix any cracks.  Remove anything that might make you trip as you walk through a door, such as a raised step or threshold.  Trim any bushes or trees on the path to your home.  Use bright outdoor lighting.  Clear any walking paths of anything that might make someone trip, such as rocks or tools.  Regularly check to see if handrails are loose or broken. Make sure that both sides of any steps have handrails.  Any raised decks and porches should have guardrails on the edges.  Have any leaves, snow, or ice cleared regularly.  Use sand or salt on walking paths during winter.  Clean up any spills in your garage right away. This includes oil or grease spills. What can I do in the bathroom?  Use night lights.  Install grab bars by the toilet and in the tub and shower. Do not use towel bars as grab bars.  Use non-skid mats or decals in the tub or shower.  If you need to sit down in the shower, use a plastic, non-slip stool.  Keep the floor dry. Clean up any water that spills on the floor as soon as it happens.  Remove soap buildup in the tub or shower regularly.  Attach bath mats securely with double-sided non-slip rug tape.  Do not have throw rugs and other  things on the floor that can make you trip. What can I do in the bedroom?  Use night lights.  Make sure that you have a light by your bed that is easy to reach.  Do not use any sheets or blankets that are too big for your bed. They should not hang down onto the floor.  Have a firm chair that has side arms. You can use this for support while you get dressed.  Do not have throw rugs and other things on the floor that can make you trip. What can I do in the kitchen?  Clean up any spills right away.  Avoid walking on wet floors.  Keep items that you use a lot in easy-to-reach places.  If you need to reach something above you, use a strong step stool that has a grab bar.  Keep electrical cords out of the way.  Do not use floor polish or wax that makes floors slippery. If you must use wax, use non-skid floor wax.  Do not have throw rugs and other things on the floor that can make you trip. What can I do with my  stairs?  Do not leave any items on the stairs.  Make sure that there are handrails on both sides of the stairs and use them. Fix handrails that are broken or loose. Make sure that handrails are as long as the stairways.  Check any carpeting to make sure that it is firmly attached to the stairs. Fix any carpet that is loose or worn.  Avoid having throw rugs at the top or bottom of the stairs. If you do have throw rugs, attach them to the floor with carpet tape.  Make sure that you have a light switch at the top of the stairs and the bottom of the stairs. If you do not have them, ask someone to add them for you. What else can I do to help prevent falls?  Wear shoes that:  Do not have high heels.  Have rubber bottoms.  Are comfortable and fit you well.  Are closed at the toe. Do not wear sandals.  If you use a stepladder:  Make sure that it is fully opened. Do not climb a closed stepladder.  Make sure that both sides of the stepladder are locked into place.  Ask  someone to hold it for you, if possible.  Clearly mark and make sure that you can see:  Any grab bars or handrails.  First and last steps.  Where the edge of each step is.  Use tools that help you move around (mobility aids) if they are needed. These include:  Canes.  Walkers.  Scooters.  Crutches.  Turn on the lights when you go into a dark area. Replace any light bulbs as soon as they burn out.  Set up your furniture so you have a clear path. Avoid moving your furniture around.  If any of your floors are uneven, fix them.  If there are any pets around you, be aware of where they are.  Review your medicines with your doctor. Some medicines can make you feel dizzy. This can increase your chance of falling. Ask your doctor what other things that you can do to help prevent falls. This information is not intended to replace advice given to you by your health care provider. Make sure you discuss any questions you have with your health care provider. Document Released: 04/16/2009 Document Revised: 11/26/2015 Document Reviewed: 07/25/2014 Elsevier Interactive Patient Education  2017 [employer].

## 2020-10-14 ENCOUNTER — Encounter: Payer: Self-pay | Admitting: Internal Medicine

## 2020-10-20 ENCOUNTER — Other Ambulatory Visit: Payer: Self-pay | Admitting: *Deleted

## 2020-10-20 DIAGNOSIS — E785 Hyperlipidemia, unspecified: Secondary | ICD-10-CM

## 2020-10-20 MED ORDER — SIMVASTATIN 40 MG PO TABS: 20.0000 mg | ORAL_TABLET | Freq: Every day | ORAL | 1 refills | Status: DC

## 2020-10-20 NOTE — Telephone Encounter (Signed)
Patient requested refill

## 2020-11-03 LAB — HEPATIC FUNCTION PANEL
ALT: 22 (ref 7–35)
AST: 27 (ref 13–35)
Alkaline Phosphatase: 66 (ref 25–125)
Bilirubin, Total: 0.5

## 2020-11-03 LAB — BASIC METABOLIC PANEL
BUN: 24 — AB (ref 4–21)
CO2: 23 — AB (ref 13–22)
Chloride: 106 (ref 99–108)
Creatinine: 1.3 — AB (ref 0.5–1.1)
Glucose: 114
Potassium: 5.1 (ref 3.4–5.3)
Sodium: 139 (ref 137–147)

## 2020-11-03 LAB — CBC AND DIFFERENTIAL
HCT: 39 (ref 36–46)
Hemoglobin: 13.4 (ref 12.0–16.0)
Platelets: 284 (ref 150–399)
WBC: 5.6

## 2020-11-03 LAB — LIPID PANEL
Cholesterol: 168 (ref 0–200)
HDL: 58 (ref 35–70)
LDL Cholesterol: 88
LDl/HDL Ratio: 2.9
Triglycerides: 111 (ref 40–160)

## 2020-11-03 LAB — COMPREHENSIVE METABOLIC PANEL
Albumin: 4.6 (ref 3.5–5.0)
Calcium: 9.9 (ref 8.7–10.7)
GFR calc Af Amer: 45.29
GFR calc non Af Amer: 39.08
Globulin: 2.4

## 2020-11-03 LAB — CBC: RBC: 4.09 (ref 3.87–5.11)

## 2020-11-04 ENCOUNTER — Encounter: Payer: Medicare Other | Admitting: Internal Medicine

## 2020-11-16 ENCOUNTER — Non-Acute Institutional Stay: Payer: Medicare Other | Admitting: Adult Health

## 2020-11-16 ENCOUNTER — Other Ambulatory Visit: Payer: Self-pay

## 2020-11-16 ENCOUNTER — Encounter: Payer: Self-pay | Admitting: Adult Health

## 2020-11-16 VITALS — BP 140/82 | HR 73 | Temp 98.1°F | Ht 66.0 in | Wt 156.2 lb

## 2020-11-16 DIAGNOSIS — Z1211 Encounter for screening for malignant neoplasm of colon: Secondary | ICD-10-CM

## 2020-11-16 DIAGNOSIS — M8580 Other specified disorders of bone density and structure, unspecified site: Secondary | ICD-10-CM

## 2020-11-16 DIAGNOSIS — F5104 Psychophysiologic insomnia: Secondary | ICD-10-CM

## 2020-11-16 DIAGNOSIS — I10 Essential (primary) hypertension: Secondary | ICD-10-CM | POA: Diagnosis not present

## 2020-11-16 DIAGNOSIS — E785 Hyperlipidemia, unspecified: Secondary | ICD-10-CM

## 2020-11-16 DIAGNOSIS — N1832 Chronic kidney disease, stage 3b: Secondary | ICD-10-CM

## 2020-11-16 DIAGNOSIS — Z8572 Personal history of non-Hodgkin lymphomas: Secondary | ICD-10-CM

## 2020-11-16 MED ORDER — SIMVASTATIN 40 MG PO TABS: 20.0000 mg | ORAL_TABLET | Freq: Every day | ORAL | 1 refills | Status: DC

## 2020-11-16 MED ORDER — TRAZODONE HCL 50 MG PO TABS: 50.0000 mg | ORAL_TABLET | Freq: Every day | ORAL | 3 refills | Status: DC

## 2020-11-16 MED ORDER — LOSARTAN POTASSIUM 50 MG PO TABS: 50.0000 mg | ORAL_TABLET | Freq: Every day | ORAL | 3 refills | Status: DC

## 2020-11-16 NOTE — Progress Notes (Signed)
Location: [place]:  Clinic  Provider:  Cindi Carbon, [place] [phone]   Code Status: DNR Goals of Care:  Advanced Directives 11/16/2020  Does Patient Have a Medical Advance Directive? Yes  Type of Advance Directive Out of facility DNR (pink MOST or yellow form)  Does patient want to make changes to medical advance directive? No - Patient declined  Copy of [place] in Chart? -  Pre-existing out of facility DNR order (yellow form or pink MOST form) -     Chief Complaint  Patient presents with  . Medical Management of Chronic Issues    Medical Management of Chronic Issues. 6 Month Follow up    HPI: Patient is a 75 y.o. female seen today for medical management of chronic diseases.    She lives by herself in the [place] but remains very active  She can feel she is getting older with some generalized aches and pains after exercise so she cut down on the frequency of her walks and playing pickle ball but remains fairly active.   Has mammogram in due in Sept  Hx of osteopenia on dexa last sept  Fasting sugar 114 on labs, felt she was eating too much sugar and she will try to do better.     Past Medical History:  Diagnosis Date  . Breast cyst   . Cataract   . Chronic kidney disease, stage 3 ([place])   . Heart murmur   . Hx of phlebitis 09/28/2016  . 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 Hx of squamous cell carcinoma 09/28/2016  . Hyperlipidemia   . Hypertension   . Non Hodgkin's lymphoma ([place]) 1989  . Osteopenia   . Phlebitis   . Sciatica of left side     Past Surgical History:  Procedure Laterality Date  . BONE MARROW TRANSPLANT  1991  . BREAST CYST EXCISION Right   . Laprascopy  1989 and 1992    Allergies  Allergen Reactions  . Sulfa Antibiotics Rash    Outpatient Encounter Medications as of 11/16/2020  Medication Sig  . aspirin EC 81 MG tablet Take 81 mg by mouth as  needed.   Renard Hamper Pepper-Turmeric (TURMERIC CURCUMIN) 11-998 MG CAPS Take 1 capsule by mouth daily.  . Cholecalciferol (D3) 50 MCG (2000 UT) TABS Take 1 tablet (2,000 Units total) by mouth daily.  . Coenzyme Q10 (CO Q 10) 100 MG CAPS Take 1 capsule by mouth daily.  Marland Kitchen losartan (COZAAR) 50 MG tablet Take 1 tablet (50 mg total) by mouth daily.  . Multiple Vitamin (MULTIVITAMIN) tablet Take 1 tablet by mouth daily.  . Omega-3 Fatty Acids (OMEGA 3 PO) Take 1,280 mg by mouth daily.  Marland Kitchen OVER THE COUNTER MEDICATION Take 1 capsule by mouth daily. Cordyceps  . OVER THE COUNTER MEDICATION Take 750 mg by mouth daily. Bacopa  . simvastatin (ZOCOR) 40 MG tablet Take 0.5 tablets (20 mg total) by mouth daily at 6 PM.  . traZODone (DESYREL) 50 MG tablet Take 1 tablet (50 mg total) by mouth at bedtime.   No facility-administered encounter medications on file as of 11/16/2020.    Review of Systems:  Review of Systems  Constitutional: Negative for activity change, appetite change, chills, diaphoresis, fatigue, fever and unexpected weight change.  HENT: Negative for congestion.   Respiratory: Negative for cough, shortness of breath and wheezing.   Cardiovascular: Negative for chest pain, palpitations and leg swelling.  Gastrointestinal: Negative for abdominal distention, abdominal pain, constipation and diarrhea.  Genitourinary: Negative for difficulty urinating and dysuria.  Musculoskeletal: Positive for arthralgias (general joint aches and pains). Negative for back pain, gait problem, joint swelling and myalgias.  Neurological: Negative for dizziness, tremors, seizures, syncope, facial asymmetry, speech difficulty, weakness, light-headedness, numbness and headaches.  Psychiatric/Behavioral: Negative for agitation, behavioral problems and confusion.     Health Maintenance  Topic Date Due  . Fecal DNA (Cologuard)  04/17/2020  . COVID-19 Vaccine (4 - Booster for Moderna series) 07/28/2020  . INFLUENZA  VACCINE  02/01/2021  . DEXA SCAN  Completed  . Hepatitis C Screening  Completed  . PNA vac Low Risk Adult  Completed  . HPV VACCINES  Aged Out    Physical Exam: Vitals:   11/16/20 1422  BP: 140/82  Pulse: 73  Temp: 98.1 F (36.7 C)  TempSrc: Skin  SpO2: 97%  Weight: 156 lb 3.2 oz (70.9 kg)  Height: 5\' 6"  (1.676 m)   Body mass index is 25.21 kg/m.  Declined breast exam  Physical Exam Vitals and nursing note reviewed.  Constitutional:      General: She is not in acute distress.    Appearance: She is not diaphoretic.  HENT:     Head: Normocephalic and atraumatic.  Neck:     Vascular: No JVD.  Cardiovascular:     Rate and Rhythm: Normal rate and regular rhythm.     Heart sounds: No murmur heard.   Pulmonary:     Effort: Pulmonary effort is normal. No respiratory distress.     Breath sounds: Normal breath sounds. No wheezing.  Skin:    General: Skin is warm and dry.  Neurological:     Mental Status: She is alert and oriented to person, place, and time.     Labs reviewed: Basic Metabolic Panel: Recent Labs    11/03/20 0300  NA 139  K 5.1  CL 106  CO2 23*  BUN 24*  CREATININE 1.3*  CALCIUM 9.9   Liver Function Tests: Recent Labs    11/03/20 0300  AST 27  ALT 22  ALKPHOS 66  ALBUMIN 4.6   No results for input(s): LIPASE, AMYLASE in the last 8760 hours. No results for input(s): AMMONIA in the last 8760 hours. CBC: Recent Labs    11/03/20 0300  WBC 5.6  HGB 13.4  HCT 39  PLT 284   Lipid Panel: Recent Labs    11/03/20 0300  CHOL 168  HDL 58  LDLCALC 88  TRIG 111   No results found for: HGBA1C  Procedures since last visit: No results found.  Assessment/Plan  1. Essential hypertension Controlled - losartan (COZAAR) 50 MG tablet; Take 1 tablet (50 mg total) by mouth daily.  Dispense: 90 tablet; Refill: 3  2. Colon cancer screening She is 81 and says this will be her last one, no family hx of colon cancer - Cologuard  3.  Hyperlipidemia, unspecified hyperlipidemia type LDL at goal 88 - simvastatin (ZOCOR) 40 MG tablet; Take 0.5 tablets (20 mg total) by mouth daily at 6 PM.  Dispense: 45 tablet; Refill: 1  4. Psychophysiological insomnia Controlled - traZODone (DESYREL) 50 MG tablet; Take 1 tablet (50 mg total) by mouth at bedtime.  Dispense: 90 tablet; Refill: 3  5. Osteopenia, unspecified location T score -1.5   Continue weight bearing exercise  6. Stage 3b chronic kidney disease (HCC) Slight increase in Cr. Continue to periodically monitor BMP and avoid nephrotoxic agents  7. History of non-Hodgkin's lymphoma No longer followed by oncology more than 10 years out   Labs/tests ordered:  BMP prior to apt Next appt:  F/U 6 months with Dr Lyndel Safe    Total time 34min:  time greater than 50% of total time spent doing pt counseling and coordination of care

## 2020-11-16 NOTE — Patient Instructions (Signed)
Encourage foods lower in sugar and follow up with Dr. Lyndel Safe for labs

## 2021-03-16 ENCOUNTER — Ambulatory Visit (HOSPITAL_BASED_OUTPATIENT_CLINIC_OR_DEPARTMENT_OTHER): Payer: Medicare Other | Admitting: Nurse Practitioner

## 2021-03-31 ENCOUNTER — Other Ambulatory Visit: Payer: Self-pay | Admitting: Internal Medicine

## 2021-03-31 DIAGNOSIS — Z1231 Encounter for screening mammogram for malignant neoplasm of breast: Secondary | ICD-10-CM

## 2021-04-02 ENCOUNTER — Ambulatory Visit: Payer: Medicare Other

## 2021-05-06 LAB — BASIC METABOLIC PANEL
BUN: 35 — AB (ref 4–21)
CO2: 21 (ref 13–22)
Chloride: 107 (ref 99–108)
Creatinine: 1.6 — AB (ref 0.5–1.1)
Glucose: 94
Potassium: 4.8 (ref 3.4–5.3)
Sodium: 144 (ref 137–147)

## 2021-05-06 LAB — COMPREHENSIVE METABOLIC PANEL: Calcium: 9.8 (ref 8.7–10.7)

## 2021-05-12 ENCOUNTER — Non-Acute Institutional Stay: Payer: Medicare Other | Admitting: Internal Medicine

## 2021-05-12 ENCOUNTER — Encounter: Payer: Self-pay | Admitting: Internal Medicine

## 2021-05-12 ENCOUNTER — Other Ambulatory Visit: Payer: Self-pay

## 2021-05-12 VITALS — BP 140/82 | HR 76 | Temp 97.6°F | Ht 66.0 in | Wt 155.6 lb

## 2021-05-12 DIAGNOSIS — E785 Hyperlipidemia, unspecified: Secondary | ICD-10-CM

## 2021-05-12 DIAGNOSIS — I10 Essential (primary) hypertension: Secondary | ICD-10-CM | POA: Diagnosis not present

## 2021-05-12 DIAGNOSIS — N1832 Chronic kidney disease, stage 3b: Secondary | ICD-10-CM | POA: Diagnosis not present

## 2021-05-12 DIAGNOSIS — F5104 Psychophysiologic insomnia: Secondary | ICD-10-CM

## 2021-05-12 DIAGNOSIS — Z8572 Personal history of non-Hodgkin lymphomas: Secondary | ICD-10-CM | POA: Diagnosis not present

## 2021-05-12 MED ORDER — SIMVASTATIN 40 MG PO TABS: 20.0000 mg | ORAL_TABLET | Freq: Every day | ORAL | 1 refills | Status: DC

## 2021-05-12 MED ORDER — LOSARTAN POTASSIUM 50 MG PO TABS
50.0000 mg | ORAL_TABLET | Freq: Every day | ORAL | 3 refills | Status: DC
  Filled 2021-10-19: qty 90, 90d supply, fill #0

## 2021-05-12 NOTE — Progress Notes (Signed)
Location:  [place] [facility]:  Clinic (12)  Provider:   Code Status:  Goals of Care:  Advanced Directives 11/16/2020  Does Patient Have a Medical Advance Directive? Yes  Type of Advance Directive Out of facility DNR (pink MOST or yellow form)  Does patient want to make changes to medical advance directive? No - Patient declined  Copy of [place] in Chart? -  Pre-existing out of facility DNR order (yellow form or pink MOST form) -     Chief Complaint  Patient presents with   Medical Management of Chronic Issues    Patient here today for her 6 month follow up.     HPI: Patient is a 75 y.o. female seen today for medical management of chronic diseases.    Has h/o HTN, HLD, Osteopenia, Stage 3 b Kidney disease, Insomnia Also h/o Non Hodgkin's Lymphoma 30 years ago. S/p Chemo and Bone marrow transplant. 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    Black Pepper-Turmeric (TURMERIC CURCUMIN) 11-998 MG CAPS Take 1 capsule by mouth daily.   Coenzyme Q10 (CO Q 10) 100 MG CAPS Take 1 capsule by mouth daily.   Multiple Vitamin (MULTIVITAMIN) tablet Take 1  tablet by mouth daily.   Omega-3 Fatty Acids (OMEGA 3 PO) Take 1,280 mg by mouth daily.   OVER THE COUNTER MEDICATION Take 1 capsule by mouth daily. Cordyceps   OVER THE COUNTER MEDICATION Take 750 mg by mouth daily. Bacopa   traZODone (DESYREL) 50 MG tablet Take 1 tablet (50 mg total) by mouth at bedtime.   [DISCONTINUED] losartan (COZAAR) 50 MG tablet Take 1 tablet (50 mg total) by mouth daily.   [DISCONTINUED] simvastatin (ZOCOR) 40 MG tablet Take 0.5 tablets (20 mg total) by mouth daily at 6 PM.   losartan (COZAAR) 50 MG tablet Take 1 tablet (50 mg total) by mouth daily.   simvastatin (ZOCOR) 40 MG tablet Take 0.5 tablets (20 mg total) by mouth daily at 6 PM.   [DISCONTINUED] Cholecalciferol (D3) 50 MCG (2000 UT) TABS Take 1 tablet (2,000 Units total) by mouth daily.   No facility-administered encounter medications on file as of 05/12/2021.    Review of Systems:  Review of Systems Review of Systems  Constitutional: Negative for activity change, appetite change, chills, diaphoresis, fatigue and fever.  HENT: Negative for mouth sores, postnasal drip, rhinorrhea, sinus pain and sore throat.   Respiratory: Negative for apnea, cough, chest tightness, shortness of breath and wheezing.  Cardiovascular: Negative for chest pain, palpitations and leg swelling.  Gastrointestinal: Negative for abdominal distention, abdominal pain, constipation, diarrhea, nausea and vomiting.  Genitourinary: Negative for dysuria and frequency.  Musculoskeletal: Negative for arthralgias, joint swelling and myalgias.  Skin: Negative for rash.  Neurological: Negative for dizziness, syncope, weakness, light-headedness and numbness.  Psychiatric/Behavioral: Negative for behavioral problems, confusion and sleep disturbance.   Health Maintenance  Topic Date Due   Fecal DNA (Cologuard)  04/17/2020   COVID-19 Vaccine (4 - Booster for Moderna series) 06/22/2020   Pneumonia Vaccine 39+ Years old  Completed   INFLUENZA  VACCINE  Completed   DEXA SCAN  Completed   Hepatitis C Screening  Completed   Zoster Vaccines- Shingrix  Completed   HPV VACCINES  Aged Out    Physical Exam: Vitals:   05/12/21 1011  BP: 140/82  Pulse: 76  Temp: 97.6 F (36.4 C)  SpO2: 97%  Weight: 155 lb 9.6 oz (70.6 kg)  Height: 5\' 6"  (1.676 m)   Body mass index is 25.11 kg/m. Physical Exam Constitutional: Oriented to person, place, and time. Well-developed and well-nourished.  HENT:  Head: Normocephalic.  Mouth/Throat: Oropharynx is clear and moist.  Eyes: Pupils are equal, round, and reactive to light.  Neck: Neck supple.  TM Normal  Cardiovascular: Normal rate and normal heart sounds.  No murmur heard. Pulmonary/Chest: Effort normal and breath sounds normal. No respiratory distress. No wheezes. She has no rales.  Abdominal: Soft. Bowel sounds are normal. No distension. There is no tenderness. There is no rebound.  Musculoskeletal: No edema.  Lymphadenopathy: none Neurological: Alert and oriented to person, place, and time. No Focal deficits Skin: Skin is warm and dry.  Psychiatric: Normal mood and affect. Behavior is normal. Thought content normal.   Labs reviewed: Basic Metabolic Panel: Recent Labs    11/03/20 0300 05/06/21 0000  NA 139 144  K 5.1 4.8  CL 106 107  CO2 23* 21  BUN 24* 35*  CREATININE 1.3* 1.6*  CALCIUM 9.9 9.8   Liver Function Tests: Recent Labs    11/03/20 0300  AST 27  ALT 22  ALKPHOS 66  ALBUMIN 4.6   No results for input(s): LIPASE, AMYLASE in the last 8760 hours. No results for input(s): AMMONIA in the last 8760 hours. CBC: Recent Labs    11/03/20 0300  WBC 5.6  HGB 13.4  HCT 39  PLT 284   Lipid Panel: Recent Labs    11/03/20 0300  CHOL 168  HDL 58  LDLCALC 88  TRIG 111   No results found for: HGBA1C  Procedures since last visit: No results found.  Assessment/Plan 1. Hyperlipidemia, unspecified hyperlipidemia type LDL less tehn 100 - simvastatin  (ZOCOR) 40 MG tablet; Take 0.5 tablets (20 mg total) by mouth daily at 6 PM.  Dispense: 45 tablet; Refill: 1  2. Essential hypertension BP was high initially but ;later came down No change in her Meds today - losartan (COZAAR) 50 MG tablet; Take 1 tablet (50 mg total) by mouth daily.  Dispense: 90 tablet; Refill: 3  3. Stage 3b chronic kidney disease ([place]) Some worsening of her Creat We discussed the cause Most likely due to Chemo many years ago for her NHL Or Hypertension She would hold off seeing renal at this time Will Continue to monitor Q 6 months Avoid NSAIDS  4. History of non-Hodgkin's lymphoma Has been in Remission  5. Psychophysiological insomnia Does well with Trazodone  Refuses Cologuard  Uptodate on Mammograms Will  get Covid Booster shot in few weeks Is due for TDAP  Labs/tests ordered:  CBC,CMP,TSH,Lipid Next appt:  11/08/2021

## 2021-05-13 ENCOUNTER — Other Ambulatory Visit: Payer: Self-pay | Admitting: Internal Medicine

## 2021-05-13 DIAGNOSIS — Z1231 Encounter for screening mammogram for malignant neoplasm of breast: Secondary | ICD-10-CM

## 2021-08-26 ENCOUNTER — Other Ambulatory Visit (HOSPITAL_BASED_OUTPATIENT_CLINIC_OR_DEPARTMENT_OTHER): Payer: Self-pay

## 2021-08-26 ENCOUNTER — Telehealth: Payer: Self-pay

## 2021-08-26 MED ORDER — LOSARTAN POTASSIUM 50 MG PO TABS: 50.0000 mg | ORAL_TABLET | Freq: Every day | ORAL | 3 refills | Status: DC

## 2021-08-26 MED ORDER — SIMVASTATIN 40 MG PO TABS: 20.0000 mg | ORAL_TABLET | Freq: Every day | ORAL | 1 refills | Status: DC

## 2021-08-26 NOTE — Telephone Encounter (Signed)
Pharmacy on file changed per patient request

## 2021-09-21 ENCOUNTER — Encounter: Payer: Self-pay | Admitting: Family

## 2021-10-19 ENCOUNTER — Other Ambulatory Visit (HOSPITAL_BASED_OUTPATIENT_CLINIC_OR_DEPARTMENT_OTHER): Payer: Self-pay

## 2021-11-08 ENCOUNTER — Encounter: Payer: Medicare Other | Admitting: Adult Health

## 2021-11-16 ENCOUNTER — Telehealth: Payer: Self-pay

## 2021-11-16 LAB — CBC AND DIFFERENTIAL
HCT: 39 (ref 36–46)
Hemoglobin: 13.5 (ref 12.0–16.0)
Platelets: 286 10*3/uL (ref 150–400)
WBC: 4.8

## 2021-11-16 LAB — CBC: RBC: 4.02 (ref 3.87–5.11)

## 2021-11-16 LAB — LIPID PANEL
Cholesterol: 157 (ref 0–200)
HDL: 64 (ref 35–70)
LDL Cholesterol: 77
LDl/HDL Ratio: 2.5
Triglycerides: 82 (ref 40–160)

## 2021-11-16 LAB — BASIC METABOLIC PANEL
BUN: 25 — AB (ref 4–21)
CO2: 20 (ref 13–22)
Chloride: 109 — AB (ref 99–108)
Creatinine: 1.2 — AB (ref 0.5–1.1)
Glucose: 94
Potassium: 5 mEq/L (ref 3.5–5.1)
Sodium: 144 (ref 137–147)

## 2021-11-16 LAB — HEPATIC FUNCTION PANEL
ALT: 19 U/L (ref 7–35)
AST: 24 (ref 13–35)
Alkaline Phosphatase: 59 (ref 25–125)
Bilirubin, Total: 0.4

## 2021-11-16 LAB — TSH: TSH: 5.78 (ref 0.41–5.90)

## 2021-11-16 LAB — COMPREHENSIVE METABOLIC PANEL
Albumin: 4.6 (ref 3.5–5.0)
Calcium: 9.6 (ref 8.7–10.7)
Globulin: 2

## 2021-11-16 NOTE — Telephone Encounter (Signed)
Spoke with patient says she did receive Cologuard test kit and will not be using it and does not want to do another one in the future. ?

## 2021-11-20 NOTE — Progress Notes (Addendum)
Location:  [facility]  POS: Clinic  Provider: Royal Hawthorn, ANP  Code Status: DNR Goals of Care:     11/16/2020    2:26 PM  Advanced Directives  Does Patient Have a Medical Advance Directive? Yes  Type of Advance Directive Out of facility DNR (pink MOST or yellow form)  Does patient want to make changes to medical advance directive? No - Patient declined     Chief Complaint  Patient presents with   Medical Management of Chronic Issues    Patient returns to the clinic today for her 6 month follow up and discuss lab results.    HPI: Patient is a 76 y.o. female seen today for medical management of chronic diseases.    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 S/p Chemo and Bone marrow transplant   Discussed filling out a most form. Wants to stay a DNR but have full scope of treatment IVF   Still walking and play pickleball. Has no acute complaints  Uses trazodone for sleep , works well  BP controlled  On zocor LDL 77 Total Cholesterol 157   Past Medical History:  Diagnosis Date   Breast cyst    Cataract    Chronic kidney disease, stage 3 (HCC)    Heart murmur    Hx of phlebitis 09/28/2016   Hx of skin cancer, basal cell 09/28/2016   Hx of squamous cell carcinoma 09/28/2016   Hyperlipidemia    Hypertension    Non Hodgkin's lymphoma ([place]) 1989   Osteopenia    Phlebitis    Sciatica of left side     Past Surgical History:  Procedure Laterality Date   BONE MARROW TRANSPLANT  1991   BREAST CYST EXCISION Right    Laprascopy  1989 and 1992    Allergies  Allergen Reactions   Sulfa Antibiotics Rash    Outpatient Encounter Medications as of 11/22/2021  Medication Sig   aspirin EC 81 MG tablet Take 81 mg by mouth as needed.    Black Pepper-Turmeric (TURMERIC CURCUMIN) 11-998 MG CAPS Take 1 capsule by mouth daily.   Coenzyme Q10 (CO Q 10) 100 MG CAPS Take 1  capsule by mouth daily.   losartan (COZAAR) 50 MG tablet Take 1 tablet (50 mg total) by mouth daily.   LUTEIN PO Take 1 tablet by mouth daily.   Multiple Vitamin (MULTIVITAMIN) tablet Take 1 tablet by mouth daily.   Omega-3 Fatty Acids (OMEGA 3 PO) Take 1,280 mg by mouth daily.   OVER THE COUNTER MEDICATION Take 1 capsule by mouth daily. Cordyceps   OVER THE COUNTER MEDICATION Take 750 mg by mouth daily. Bacopa   simvastatin (ZOCOR) 40 MG tablet Take 0.5 tablets (20 mg total) by mouth daily at 6 PM.   traZODone (DESYREL) 50 MG tablet Take 1 tablet (50 mg total) by mouth at bedtime.   [DISCONTINUED] losartan (COZAAR) 50 MG tablet Take 1 tablet (50 mg total) by mouth daily.   [DISCONTINUED] losartan (COZAAR) 50 MG tablet Take 1 tablet (50 mg total) by mouth daily.   [DISCONTINUED] simvastatin (ZOCOR) 40 MG tablet Take 0.5 tablets (20 mg total) by mouth daily at 6 PM.   [DISCONTINUED] simvastatin (ZOCOR) 40 MG tablet Take 0.5 tablets (20 mg total) by mouth daily at 6 PM.   [DISCONTINUED] simvastatin (ZOCOR) 40 MG tablet Take 0.5 tablets (20 mg total) by mouth daily at 6 PM.   [  DISCONTINUED] traZODone (DESYREL) 50 MG tablet Take 1 tablet (50 mg total) by mouth at bedtime.   No facility-administered encounter medications on file as of 11/22/2021.    Review of Systems:  Review of Systems  Constitutional:  Negative for activity change, appetite change, chills, diaphoresis, fatigue, fever and unexpected weight change.  HENT:  Negative for congestion.   Respiratory:  Negative for cough, shortness of breath and wheezing.   Cardiovascular:  Negative for chest pain, palpitations and leg swelling.  Gastrointestinal:  Negative for abdominal distention, abdominal pain, constipation and diarrhea.  Genitourinary:  Negative for difficulty urinating and dysuria.  Musculoskeletal:  Negative for arthralgias, back pain, gait problem, joint swelling and myalgias.  Neurological:  Negative for dizziness, tremors,  seizures, syncope, facial asymmetry, speech difficulty, weakness, light-headedness, numbness and headaches.  Psychiatric/Behavioral:  Negative for agitation, behavioral problems and confusion.    Health Maintenance  Topic Date Due   INFLUENZA VACCINE  02/01/2022   Pneumonia Vaccine 20+ Years old  Completed   DEXA SCAN  Completed   COVID-19 Vaccine  Completed   Hepatitis C Screening  Completed   Zoster Vaccines- Shingrix  Completed   HPV VACCINES  Aged Out   Fecal DNA (Cologuard)  Discontinued    Physical Exam: Vitals:   11/22/21 1320  BP: 136/68  Pulse: 76  Temp: 97.7 F (36.5 C)  SpO2: 98%  Weight: 158 lb (71.7 kg)  Height: '5\' 6"'$  (1.676 m)   Body mass index is 25.5 kg/m. Physical Exam Vitals and nursing note reviewed.  Constitutional:      General: She is not in acute distress.    Appearance: She is not diaphoretic.  HENT:     Head: Normocephalic and atraumatic.     Right Ear: Tympanic membrane normal.     Left Ear: Tympanic membrane normal.     Ears:     Comments: Small amt of dark cerumen noted in both ears. Irregular dark border to both TM    Nose: Nose normal.     Mouth/Throat:     Mouth: Mucous membranes are moist.     Pharynx: Oropharynx is clear.  Eyes:     Conjunctiva/sclera: Conjunctivae normal.     Pupils: Pupils are equal, round, and reactive to light.  Neck:     Vascular: No JVD.  Cardiovascular:     Rate and Rhythm: Normal rate and regular rhythm.     Heart sounds: No murmur heard. Pulmonary:     Effort: Pulmonary effort is normal. No respiratory distress.     Breath sounds: Normal breath sounds. No wheezing.  Abdominal:     General: Bowel sounds are normal. There is no distension.     Palpations: Abdomen is soft. There is no mass.     Tenderness: There is no abdominal tenderness.  Musculoskeletal:     Cervical back: No rigidity or tenderness.     Right lower leg: No edema.     Left lower leg: No edema.  Lymphadenopathy:     Cervical: No  cervical adenopathy.  Skin:    General: Skin is warm and dry.  Neurological:     Mental Status: She is alert and oriented to person, place, and time.  Psychiatric:        Mood and Affect: Mood normal.    Labs reviewed: Basic Metabolic Panel: Recent Labs    05/06/21 0000 11/16/21 0000  NA 144 144  K 4.8 5.0  CL 107 109*  CO2 21 20  BUN 35* 25*  CREATININE 1.6* 1.2*  CALCIUM 9.8 9.6  TSH  --  5.78   Liver Function Tests: Recent Labs    11/16/21 0000  AST 24  ALT 19  ALKPHOS 59  ALBUMIN 4.6   No results for input(s): LIPASE, AMYLASE in the last 8760 hours. No results for input(s): AMMONIA in the last 8760 hours. CBC: Recent Labs    11/16/21 0000  WBC 4.8  HGB 13.5  HCT 39  PLT 286   Lipid Panel: Recent Labs    11/16/21 0000  CHOL 157  HDL 64  LDLCALC 77  TRIG 82   No results found for: HGBA1C  Procedures since last visit: No results found.  Assessment/Plan  1. Essential hypertension Controlled - losartan (COZAAR) 50 MG tablet; Take 1 tablet (50 mg total) by mouth daily.  Dispense: 90 tablet; Refill: 3  2. Hyperlipidemia, unspecified hyperlipidemia type  - simvastatin (ZOCOR) 40 MG tablet; Take 0.5 tablets (20 mg total) by mouth daily at 6 PM.  Dispense: 45 tablet; Refill: 1  3. Psychophysiological insomnia  - traZODone (DESYREL) 50 MG tablet; Take 1 tablet (50 mg total) by mouth at bedtime.  Dispense: 90 tablet; Refill: 3  4. Subclinical hypothyroidism TSH 5.78 No current symptoms, will continue to follow  5. Stage 3a chronic kidney disease ([place]) Continue to periodically monitor BMP and avoid nephrotoxic agents Hydrate  6. Advanced care planning We filled out her most form today. She has a DNR and does not want tube feeding. If she had an acute episode of illness she would want hospitalization and treatment. For instance if she had an arrhythmia that could be reversed she would want treatment. She does not want any life prolonging measures  such as prolonged ventilation or tube feeding. She was encouraged to be sure that her HCPOA is aware of her wishes and will represent her well in the event they are needed. She is in overall good health and has a good quality of life.   Labs/tests ordered:  * No order type specified * TSH BMP Next appt:  6 months with Dr Darnell Level   Total time 24mn:  time greater than 50% of total time spent doing pt counseling and coordination of care

## 2021-11-22 ENCOUNTER — Encounter: Payer: Self-pay | Admitting: Adult Health

## 2021-11-22 ENCOUNTER — Other Ambulatory Visit (HOSPITAL_BASED_OUTPATIENT_CLINIC_OR_DEPARTMENT_OTHER): Payer: Self-pay

## 2021-11-22 ENCOUNTER — Non-Acute Institutional Stay: Payer: Medicare Other | Admitting: Adult Health

## 2021-11-22 VITALS — BP 136/68 | HR 76 | Temp 97.7°F | Ht 66.0 in | Wt 158.0 lb

## 2021-11-22 DIAGNOSIS — E785 Hyperlipidemia, unspecified: Secondary | ICD-10-CM | POA: Diagnosis not present

## 2021-11-22 DIAGNOSIS — I10 Essential (primary) hypertension: Secondary | ICD-10-CM | POA: Diagnosis not present

## 2021-11-22 DIAGNOSIS — Z7189 Other specified counseling: Secondary | ICD-10-CM

## 2021-11-22 DIAGNOSIS — F5104 Psychophysiologic insomnia: Secondary | ICD-10-CM

## 2021-11-22 DIAGNOSIS — N1831 Chronic kidney disease, stage 3a: Secondary | ICD-10-CM

## 2021-11-22 DIAGNOSIS — E038 Other specified hypothyroidism: Secondary | ICD-10-CM

## 2021-11-22 MED ORDER — TRAZODONE HCL 50 MG PO TABS
50.0000 mg | ORAL_TABLET | Freq: Every day | ORAL | 3 refills | Status: DC
  Filled 2021-11-22: qty 90, 90d supply, fill #0

## 2021-11-22 MED ORDER — LOSARTAN POTASSIUM 50 MG PO TABS
50.0000 mg | ORAL_TABLET | Freq: Every day | ORAL | 3 refills | Status: DC
  Filled 2021-11-22 – 2022-01-23 (×2): qty 90, 90d supply, fill #0
  Filled 2022-04-23: qty 90, 90d supply, fill #1

## 2021-11-22 MED ORDER — SIMVASTATIN 40 MG PO TABS
20.0000 mg | ORAL_TABLET | Freq: Every day | ORAL | 1 refills | Status: DC
  Filled 2021-11-22: qty 45, 90d supply, fill #0
  Filled 2022-02-20: qty 45, 90d supply, fill #1

## 2021-11-22 NOTE — Patient Instructions (Signed)
Heather RN will call you to schedule your blood work before follow up.

## 2022-01-24 ENCOUNTER — Other Ambulatory Visit (HOSPITAL_BASED_OUTPATIENT_CLINIC_OR_DEPARTMENT_OTHER): Payer: Self-pay

## 2022-02-21 ENCOUNTER — Other Ambulatory Visit (HOSPITAL_BASED_OUTPATIENT_CLINIC_OR_DEPARTMENT_OTHER): Payer: Self-pay

## 2022-04-14 ENCOUNTER — Ambulatory Visit
Admission: RE | Admit: 2022-04-14 | Discharge: 2022-04-14 | Disposition: A | Discharge status: Home / Self Care | Payer: Medicare Other | Source: Ambulatory Visit | Attending: Internal Medicine | Admitting: Internal Medicine

## 2022-04-14 DIAGNOSIS — Z1231 Encounter for screening mammogram for malignant neoplasm of breast: Secondary | ICD-10-CM

## 2022-04-15 ENCOUNTER — Other Ambulatory Visit: Payer: Self-pay | Admitting: Internal Medicine

## 2022-04-15 DIAGNOSIS — R928 Other abnormal and inconclusive findings on diagnostic imaging of breast: Secondary | ICD-10-CM

## 2022-04-25 ENCOUNTER — Other Ambulatory Visit (HOSPITAL_BASED_OUTPATIENT_CLINIC_OR_DEPARTMENT_OTHER): Payer: Self-pay

## 2022-05-12 ENCOUNTER — Other Ambulatory Visit: Payer: Self-pay | Admitting: Internal Medicine

## 2022-05-12 ENCOUNTER — Ambulatory Visit
Admission: RE | Admit: 2022-05-12 | Discharge: 2022-05-12 | Disposition: A | Discharge status: Home / Self Care | Payer: Medicare Other | Source: Ambulatory Visit | Attending: Internal Medicine | Admitting: Internal Medicine

## 2022-05-12 DIAGNOSIS — R928 Other abnormal and inconclusive findings on diagnostic imaging of breast: Secondary | ICD-10-CM

## 2022-05-12 DIAGNOSIS — R921 Mammographic calcification found on diagnostic imaging of breast: Secondary | ICD-10-CM

## 2022-05-17 LAB — BASIC METABOLIC PANEL
BUN: 23 — AB (ref 4–21)
CO2: 22 (ref 13–22)
Chloride: 106 (ref 99–108)
Creatinine: 1.1 (ref 0.5–1.1)
Glucose: 96
Potassium: 4.6 mEq/L (ref 3.5–5.1)
Sodium: 141 (ref 137–147)

## 2022-05-17 LAB — COMPREHENSIVE METABOLIC PANEL
Calcium: 9.6 (ref 8.7–10.7)
eGFR: 50

## 2022-05-24 ENCOUNTER — Non-Acute Institutional Stay: Payer: Medicare Other | Admitting: Internal Medicine

## 2022-05-24 ENCOUNTER — Encounter: Payer: Self-pay | Admitting: Internal Medicine

## 2022-05-24 VITALS — BP 136/68 | HR 84 | Temp 97.7°F | Resp 17 | Ht 66.0 in | Wt 156.2 lb

## 2022-05-24 DIAGNOSIS — I10 Essential (primary) hypertension: Secondary | ICD-10-CM | POA: Diagnosis not present

## 2022-05-24 DIAGNOSIS — R7989 Other specified abnormal findings of blood chemistry: Secondary | ICD-10-CM | POA: Diagnosis not present

## 2022-05-24 DIAGNOSIS — E2839 Other primary ovarian failure: Secondary | ICD-10-CM

## 2022-05-24 DIAGNOSIS — E785 Hyperlipidemia, unspecified: Secondary | ICD-10-CM | POA: Diagnosis not present

## 2022-05-24 DIAGNOSIS — N1831 Chronic kidney disease, stage 3a: Secondary | ICD-10-CM

## 2022-05-24 DIAGNOSIS — F5101 Primary insomnia: Secondary | ICD-10-CM | POA: Diagnosis not present

## 2022-05-24 MED ORDER — SIMVASTATIN 40 MG PO TABS: 20.0000 mg | ORAL_TABLET | Freq: Every day | ORAL | 1 refills | Status: DC

## 2022-05-25 ENCOUNTER — Encounter: Payer: Medicare Other | Admitting: Internal Medicine

## 2022-05-29 NOTE — Progress Notes (Signed)
Location:  [facility] of Service:  Clinic (12)  Provider:   Code Status:  Goals of Care:     05/24/2022    2:08 PM  Advanced Directives  Does Patient Have a Medical Advance Directive? Yes  Type of Advance Directive Living will;Out of facility DNR (pink MOST or yellow form);Healthcare Power of Attorney  Does patient want to make changes to medical advance directive? No - Patient declined  Copy of [place] in Chart? No - copy requested     Chief Complaint  Patient presents with   Medical Management of Chronic Issues    6 month follow up patient need refill for simvastatin   Immunizations    Discussed the need for covid and flu vaccine     HPI: Patient is a 76 y.o. female seen today for medical management of chronic diseases.    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 S/p Chemo and Bone marrow transplant. In Remission   Past Medical History:  Diagnosis Date   Breast cyst    Cataract    Chronic kidney disease, stage 3 (HCC)    Heart murmur    Hx of phlebitis 09/28/2016   Hx of skin cancer, basal cell 09/28/2016   Hx of squamous cell carcinoma 09/28/2016   Hyperlipidemia    Hypertension    Non Hodgkin's lymphoma ([place]) 1989   Osteopenia    Phlebitis    Sciatica of left side     Past Surgical History:  Procedure Laterality Date   BONE MARROW TRANSPLANT  1991   BREAST CYST EXCISION Right    Laprascopy  1989 and 1992    Allergies  Allergen Reactions   Sulfa Antibiotics Rash    Outpatient Encounter Medications as of 05/24/2022  Medication Sig   aspirin EC 81 MG tablet Take 81 mg by mouth as needed.    Black Pepper-Turmeric (TURMERIC CURCUMIN) 11-998 MG CAPS Take 1 capsule by mouth daily.   Coenzyme Q10 (CO Q 10) 100 MG CAPS Take 1 capsule by mouth daily.   losartan (COZAAR) 50 MG tablet Take 1 tablet (50 mg total)  by mouth daily.   LUTEIN PO Take 1 tablet by mouth daily.   Multiple Vitamin (MULTIVITAMIN) tablet Take 1 tablet by mouth daily.   Omega-3 Fatty Acids (OMEGA 3 PO) Take 1,280 mg by mouth daily.   OVER THE COUNTER MEDICATION Take 1 capsule by mouth daily. Cordyceps   OVER THE COUNTER MEDICATION Take 750 mg by mouth daily. Bacopa   traZODone (DESYREL) 50 MG tablet Take 1 tablet (50 mg total) by mouth at bedtime. (Patient taking differently: Take 25 mg by mouth at bedtime as needed for sleep.)   [DISCONTINUED] simvastatin (ZOCOR) 40 MG tablet Take 0.5 tablets (20 mg total) by mouth daily at 6 PM.   simvastatin (ZOCOR) 40 MG tablet Take 0.5 tablets (20 mg total) by mouth daily at 6 PM.   No facility-administered encounter medications on file as of 05/24/2022.    Review of Systems:  Review of Systems  Constitutional:  Negative for activity change and appetite change.  HENT: Negative.    Respiratory:  Negative for cough and shortness of breath.   Cardiovascular:  Negative for leg swelling.  Gastrointestinal:  Negative for constipation.  Genitourinary: Negative.   Musculoskeletal:  Negative for arthralgias, gait problem and myalgias.  Skin: Negative.  Neurological:  Negative for dizziness and weakness.  Psychiatric/Behavioral:  Negative for confusion, dysphoric mood and sleep disturbance.     Health Maintenance  Topic Date Due   Medicare Annual Wellness (AWV)  09/17/2021   INFLUENZA VACCINE  02/01/2022   COVID-19 Vaccine (5 - 2023-24 season) 03/04/2022   Pneumonia Vaccine 32+ Years old  Completed   DEXA SCAN  Completed   Hepatitis C Screening  Completed   Zoster Vaccines- Shingrix  Completed   HPV VACCINES  Aged Out   Fecal DNA (Cologuard)  Discontinued    Physical Exam: Vitals:   05/24/22 1358  BP: 136/68  Pulse: 84  Resp: 17  Temp: 97.7 F (36.5 C)  TempSrc: Temporal  SpO2: 97%  Weight: 156 lb 3.2 oz (70.9 kg)  Height: '5\' 6"'$  (1.676 m)   Body mass index is 25.21  kg/m. Physical Exam Vitals reviewed.  Constitutional:      Appearance: Normal appearance.  HENT:     Head: Normocephalic.     Nose: Nose normal.     Mouth/Throat:     Mouth: Mucous membranes are moist.     Pharynx: Oropharynx is clear.  Eyes:     Pupils: Pupils are equal, round, and reactive to light.  Cardiovascular:     Rate and Rhythm: Normal rate and regular rhythm.     Pulses: Normal pulses.     Heart sounds: Normal heart sounds. No murmur heard. Pulmonary:     Effort: Pulmonary effort is normal.     Breath sounds: Normal breath sounds.  Abdominal:     General: Abdomen is flat. Bowel sounds are normal.     Palpations: Abdomen is soft.  Musculoskeletal:        General: No swelling.     Cervical back: Neck supple.  Skin:    General: Skin is warm.  Neurological:     General: No focal deficit present.     Mental Status: She is alert and oriented to person, place, and time.  Psychiatric:        Mood and Affect: Mood normal.        Thought Content: Thought content normal.     Labs reviewed: Basic Metabolic Panel: Recent Labs    11/16/21 0000 05/17/22 0000  NA 144 141  K 5.0 4.6  CL 109* 106  CO2 20 22  BUN 25* 23*  CREATININE 1.2* 1.1  CALCIUM 9.6 9.6  TSH 5.78  --    Liver Function Tests: Recent Labs    11/16/21 0000  AST 24  ALT 19  ALKPHOS 59  ALBUMIN 4.6   No results for input(s): "LIPASE", "AMYLASE" in the last 8760 hours. No results for input(s): "AMMONIA" in the last 8760 hours. CBC: Recent Labs    11/16/21 0000  WBC 4.8  HGB 13.5  HCT 39  PLT 286   Lipid Panel: Recent Labs    11/16/21 0000  CHOL 157  HDL 64  LDLCALC 77  TRIG 82   No results found for: "HGBA1C"  Procedures since last visit: MM Digital Diagnostic Unilat L  Result Date: 05/12/2022 CLINICAL DATA:  76 year old female presenting as a recall from screening for possible left breast calcifications. EXAM: DIGITAL DIAGNOSTIC UNILATERAL LEFT MAMMOGRAM WITH CAD  TECHNIQUE: Left digital diagnostic mammography was performed. COMPARISON:  Previous exam(s). ACR Breast Density Category c: The breast tissue is heterogeneously dense, which may obscure small masses. FINDINGS: Spot 2D magnification views and full true lateral 2D views of the left breast  were performed for calcifications in the lower outer left breast. Calcifications are difficult to image due to far posterior location. There is persistence of a faint group of calcifications measuring approximately 0.6 cm best seen on the true lateral 2 of 2 magnification view. No associated mass or distortion. No new findings elsewhere. IMPRESSION: Indeterminate calcifications spanning 0.6 cm in the lower outer left breast. RECOMMENDATION: Stereotactic core needle biopsy x1 of the left breast. I have discussed the findings and recommendations with the patient. If applicable, a reminder letter will be sent to the patient regarding the next appointment. BI-RADS CATEGORY  4: Suspicious. Electronically Signed   By: Audie Pinto M.D.   On: 05/12/2022 14:08   Assessment/Plan 1. Essential hypertension Controlled on Cozaar  2. Hyperlipidemia, unspecified hyperlipidemia type  - simvastatin (ZOCOR) 40 MG tablet; Take 0.5 tablets (20 mg total) by mouth daily at 6 PM.  Dispense: 45 tablet; Refill: 1  3. Primary insomnia Trazodone  4. Elevated TSH Continue to follow  5. Stage 3a chronic kidney disease (HCC) Creat stable  Refuses Cologuard and Also refusing Wellness visit DEXA ordered Uptodate on Mammograms Will get Covid Booster shot and Flu Shot in few weeks Is due for TDAP  Labs/tests ordered:  Labs before Next visit Next appt:  Visit date not found

## 2022-05-30 ENCOUNTER — Ambulatory Visit
Admission: RE | Admit: 2022-05-30 | Discharge: 2022-05-30 | Disposition: A | Discharge status: Home / Self Care | Payer: Medicare Other | Source: Ambulatory Visit | Attending: Internal Medicine | Admitting: Internal Medicine

## 2022-05-30 DIAGNOSIS — R928 Other abnormal and inconclusive findings on diagnostic imaging of breast: Secondary | ICD-10-CM

## 2022-05-30 DIAGNOSIS — R921 Mammographic calcification found on diagnostic imaging of breast: Secondary | ICD-10-CM

## 2022-05-30 HISTORY — PX: BREAST BIOPSY: SHX20

## 2022-06-01 ENCOUNTER — Telehealth: Payer: Self-pay | Admitting: Hematology

## 2022-06-01 NOTE — Telephone Encounter (Signed)
Spoke to patient to confirm upcoming afternoon [facility] clinic appointment on 12/6 paperwork will be sent via mail.   Gave location and time, also informed patient that the surgeon's office would be calling as well to get information from them similar to the packet that they will be receiving so make sure to do both.  Reminded patient that all providers will be coming to the clinic to see them HERE and if they had any questions to not hesitate to reach back out to myself or their navigators.

## 2022-06-03 ENCOUNTER — Encounter: Payer: Self-pay | Admitting: *Deleted

## 2022-06-03 DIAGNOSIS — Z170 Estrogen receptor positive status [ER+]: Secondary | ICD-10-CM | POA: Insufficient documentation

## 2022-06-03 DIAGNOSIS — D0512 Intraductal carcinoma in situ of left breast: Secondary | ICD-10-CM | POA: Insufficient documentation

## 2022-06-06 ENCOUNTER — Encounter: Payer: Self-pay | Admitting: *Deleted

## 2022-06-06 ENCOUNTER — Ambulatory Visit: Payer: Self-pay | Admitting: Surgery

## 2022-06-06 DIAGNOSIS — D0512 Intraductal carcinoma in situ of left breast: Secondary | ICD-10-CM

## 2022-06-08 ENCOUNTER — Other Ambulatory Visit: Payer: Self-pay | Admitting: *Deleted

## 2022-06-08 ENCOUNTER — Ambulatory Visit: Payer: Medicare Other | Admitting: Physical Therapy

## 2022-06-08 ENCOUNTER — Inpatient Hospital Stay: Payer: Medicare Other

## 2022-06-08 ENCOUNTER — Ambulatory Visit: Payer: Self-pay | Admitting: Surgery

## 2022-06-08 ENCOUNTER — Ambulatory Visit
Admission: RE | Admit: 2022-06-08 | Discharge: 2022-06-08 | Disposition: A | Discharge status: Home / Self Care | Payer: Medicare Other | Source: Ambulatory Visit | Attending: Radiation Oncology | Admitting: Radiation Oncology

## 2022-06-08 ENCOUNTER — Telehealth: Payer: Self-pay | Admitting: Genetic Counselor

## 2022-06-08 ENCOUNTER — Encounter: Payer: Self-pay | Admitting: Hematology

## 2022-06-08 ENCOUNTER — Inpatient Hospital Stay: Payer: Medicare Other | Attending: Hematology | Admitting: Hematology

## 2022-06-08 VITALS — BP 155/79 | HR 78 | Temp 97.7°F | Resp 16 | Ht 66.0 in | Wt 155.0 lb

## 2022-06-08 DIAGNOSIS — D0512 Intraductal carcinoma in situ of left breast: Secondary | ICD-10-CM | POA: Insufficient documentation

## 2022-06-08 DIAGNOSIS — F17210 Nicotine dependence, cigarettes, uncomplicated: Secondary | ICD-10-CM | POA: Diagnosis not present

## 2022-06-08 DIAGNOSIS — N183 Chronic kidney disease, stage 3 unspecified: Secondary | ICD-10-CM

## 2022-06-08 DIAGNOSIS — Z170 Estrogen receptor positive status [ER+]: Secondary | ICD-10-CM | POA: Diagnosis not present

## 2022-06-08 DIAGNOSIS — Z8572 Personal history of non-Hodgkin lymphomas: Secondary | ICD-10-CM | POA: Diagnosis not present

## 2022-06-08 DIAGNOSIS — Z923 Personal history of irradiation: Secondary | ICD-10-CM | POA: Diagnosis not present

## 2022-06-08 DIAGNOSIS — Z87891 Personal history of nicotine dependence: Secondary | ICD-10-CM

## 2022-06-08 DIAGNOSIS — Z9221 Personal history of antineoplastic chemotherapy: Secondary | ICD-10-CM

## 2022-06-08 LAB — CMP (CANCER CENTER ONLY)
ALT: 23 U/L (ref 0–44)
AST: 29 U/L (ref 15–41)
Albumin: 4.4 g/dL (ref 3.5–5.0)
Alkaline Phosphatase: 56 U/L (ref 38–126)
Anion gap: 7 (ref 5–15)
BUN: 22 mg/dL (ref 8–23)
CO2: 25 mmol/L (ref 22–32)
Calcium: 9.7 mg/dL (ref 8.9–10.3)
Chloride: 108 mmol/L (ref 98–111)
Creatinine: 1.31 mg/dL — ABNORMAL HIGH (ref 0.44–1.00)
GFR, Estimated: 42 mL/min — ABNORMAL LOW (ref >60)
Glucose, Bld: 108 mg/dL — ABNORMAL HIGH (ref 70–99)
Potassium: 4.4 mmol/L (ref 3.5–5.1)
Sodium: 140 mmol/L (ref 135–145)
Total Bilirubin: 0.7 mg/dL (ref 0.3–1.2)
Total Protein: 7.2 g/dL (ref 6.5–8.1)

## 2022-06-08 LAB — CBC WITH DIFFERENTIAL (CANCER CENTER ONLY)
Abs Immature Granulocytes: 0.01 10*3/uL (ref 0.00–0.07)
Basophils Absolute: 0 10*3/uL (ref 0.0–0.1)
Basophils Relative: 0 %
Eosinophils Absolute: 0 10*3/uL (ref 0.0–0.5)
Eosinophils Relative: 1 %
HCT: 38.5 % (ref 36.0–46.0)
Hemoglobin: 13.4 g/dL (ref 12.0–15.0)
Immature Granulocytes: 0 %
Lymphocytes Relative: 24 %
Lymphs Abs: 1.4 10*3/uL (ref 0.7–4.0)
MCH: 33.5 pg (ref 26.0–34.0)
MCHC: 34.8 g/dL (ref 30.0–36.0)
MCV: 96.3 fL (ref 80.0–100.0)
Monocytes Absolute: 0.5 10*3/uL (ref 0.1–1.0)
Monocytes Relative: 8 %
Neutro Abs: 3.9 10*3/uL (ref 1.7–7.7)
Neutrophils Relative %: 67 %
Platelet Count: 235 10*3/uL (ref 150–400)
RBC: 4 MIL/uL (ref 3.87–5.11)
RDW: 12.6 % (ref 11.5–15.5)
WBC Count: 5.9 10*3/uL (ref 4.0–10.5)
nRBC: 0 % (ref 0.0–0.2)

## 2022-06-08 LAB — GENETIC SCREENING ORDER

## 2022-06-08 NOTE — H&P (View-Only) (Signed)
Subjective    Chief Complaint: Breast Cancer   Breast [place] 06/08/22 - Feng/ [place]   History of Present Illness: Sierra Summers is a 76 y.o. female who is seen today as an office consultation at the request of Dr. Burr Medico for evaluation of Breast Cancer .   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  She has no family history of breast cancer.  She presents after recent screening mammogram showed a suspicious area of calcifications in the left lower outer quadrant.  The only previous breast problems included excision of a right breast cyst many years ago.  Further workup revealed a 6 mm area of calcifications in the lower outer quadrant.  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  ER/PR strongly positive.  She presents now to discuss her treatment options.  She is accompanied by her sister.  The patient is very active and appears much younger than stated age.       Review of Systems:     ROS  Constitutional: Denies fevers, chills or abnormal night sweats Eyes: Denies blurriness of vision, double vision or watery eyes Ears, nose, mouth, throat, and face: Denies mucositis or sore throat Respiratory: Denies cough, dyspnea or wheezes Cardiovascular: Denies palpitation, chest discomfort or lower extremity swelling Gastrointestinal:  Denies nausea, heartburn or change in bowel habits Skin: Denies abnormal skin rashes Lymphatics: Denies new lymphadenopathy or easy bruising Neurological:Denies numbness, tingling or new weaknesses Behavioral/Psych: Mood is stable, no new changes  All other systems were reviewed with the patient and are negative   Medical History: Past Medical History      Past Medical History:  Diagnosis Date   Anxiety     Arthritis     Asthma, unspecified asthma severity, unspecified whether complicated, unspecified whether persistent      Chronic kidney disease     History of cancer     Hyperlipidemia     Hypertension             Patient Active Problem List  Diagnosis   Ductal carcinoma in situ (DCIS) of left breast   Chronic kidney disease, stage 3 unspecified (CMS-HCC)   Essential hypertension   History of non-Hodgkin's lymphoma   Hx of bone marrow transplant (CMS-HCC)   Hyperlipidemia   Hx of skin cancer, basal cell      Past Surgical History       Past Surgical History:  Procedure Laterality Date   bone marrow transplant       e/o breast cyst            Allergies      Allergies  Allergen Reactions   Sulfa (Sulfonamide Antibiotics) Rash              Current Outpatient Medications on File Prior to Visit  Medication Sig Dispense Refill   aspirin 81 MG EC tablet Take by mouth       losartan (COZAAR) 50 MG tablet Take 50 mg by mouth once daily       multivitamin tablet Take 1 tablet by mouth once daily       omega-3 acid ethyl esters (LOVAZA) 1 gram capsule Take 2 g by mouth 2 (two) times daily       simvastatin (ZOCOR) 40 MG tablet Take by mouth       TURMERIC ORAL Take by mouth  No current facility-administered medications on file prior to visit.      Family History       Family History  Problem Relation Age of Onset   Stroke Mother     Hyperlipidemia (Elevated cholesterol) Mother     Hyperlipidemia (Elevated cholesterol) Father     Diabetes Father     Hyperlipidemia (Elevated cholesterol) Sister          Social History        Tobacco Use  Smoking Status Former   Types: Cigarettes   Quit date: 1992   Years since quitting: 31.9  Smokeless Tobacco Never      Social History  Social History         Socioeconomic History   Marital status: Legally Separated  Tobacco Use   Smoking status: Former      Types: Cigarettes      Quit date: 1992      Years since quitting: 31.9   Smokeless tobacco: Never        Objective:      Physical Exam    Constitutional:  WDWN in  NAD, conversant, no obvious deformities; lying in bed comfortably Eyes:  Pupils equal, round; sclera anicteric; moist conjunctiva; no lid lag HENT:  Oral mucosa moist; good dentition  Neck:  No masses palpated, trachea midline; no thyromegaly Lungs:  CTA bilaterally; normal respiratory effort Breasts:  symmetric, no nipple changes; no palpable masses or lymphadenopathy on either side; slight left lateral breast ecchymosis from her biopsy.  No palpable hematoma. CV:  Regular rate and rhythm; no murmurs; extremities well-perfused with no edema Abd:  +bowel sounds, soft, non-tender, no palpable organomegaly; no palpable hernias Musc:  Normal gait; no apparent clubbing or cyanosis in extremities Lymphatic:  No palpable cervical or axillary lymphadenopathy Skin:  Warm, dry; no sign of jaundice Psychiatric - alert and oriented x 4; calm mood and affect     Labs, Imaging and Diagnostic Testing:    Diagnosis Breast, left, needle core biopsy, lower outer - DUCTAL CARCINOMA IN SITU, SOLID WITH APOCRINE FEATURES, INTERMEDIATE GRADE - NECROSIS: PRESENT, FOCAL - CALCIFICATIONS: PRESENT - DCIS LENGTH: 0.6 CM CM NOTE: DR. Arby Barrette REVIEWED THE CASE AND CONCURS WITH THE INTERPRETATION. A BREAST PROGNOSTIC PROFILE (ER AND PR) IS PENDING AND WILL BE REPORTED IN AN ADDENDUM. [facility] WAS NOTIFIED ON 05/31/2022. Tilford Pillar M.D. Pathologist, Electronic Signature (Case signed 05/31/2022)   PROGNOSTIC INDICATORS Results: IMMUNOHISTOCHEMICAL AND MORPHOMETRIC ANALYSIS PERFORMED MANUALLY Estrogen Receptor: 100%, POSITIVE, STRONG STAINING INTENSITY Progesterone Receptor: 90%, POSITIVE, STRONG STAINING INTENSITY REFERENCE RANGE ESTROGEN RECEPTOR NEGATIVE 0% POSITIVE =>1% REFERENCE RANGE PROGESTERONE RECEPTOR NEGATIVE 0% POSITIVE =>1% All controls stained appropriately Casimer Lanius MD Pathologist, Electronic Signature ( Signed 06/01/2022)   CLINICAL DATA:  Screening.    EXAM: DIGITAL SCREENING BILATERAL MAMMOGRAM WITH TOMOSYNTHESIS AND CAD   TECHNIQUE: Bilateral screening digital craniocaudal and mediolateral oblique mammograms were obtained. Bilateral screening digital breast tomosynthesis was performed. The images were evaluated with computer-aided detection.   COMPARISON:  Previous exam(s).   ACR Breast Density Category c: The breast tissue is heterogeneously dense, which may obscure small masses.   FINDINGS: In the left breast, calcifications warrant further evaluation. In the right breast, no findings suspicious for malignancy.   IMPRESSION: Further evaluation is suggested for calcifications in the left breast.   RECOMMENDATION: Diagnostic mammogram of the left breast. (Code:[id number])   The patient will be contacted regarding the findings, and additional imaging will be scheduled.   BI-RADS  CATEGORY  0: Incomplete. Need additional imaging evaluation and/or prior mammograms for comparison.     Electronically Signed   By: Margarette Canada M.D.   On: 04/14/2022 13:33   CLINICAL DATA:  76 year old female presenting as a recall from screening for possible left breast calcifications.   EXAM: DIGITAL DIAGNOSTIC UNILATERAL LEFT MAMMOGRAM WITH CAD   TECHNIQUE: Left digital diagnostic mammography was performed.   COMPARISON:  Previous exam(s).   ACR Breast Density Category c: The breast tissue is heterogeneously dense, which may obscure small masses.   FINDINGS: Spot 2D magnification views and full true lateral 2D views of the left breast were performed for calcifications in the lower outer left breast. Calcifications are difficult to image due to far posterior location. There is persistence of a faint group of calcifications measuring approximately 0.6 cm best seen on the true lateral 2 of 2 magnification view. No associated mass or distortion. No new findings elsewhere.   IMPRESSION: Indeterminate calcifications spanning 0.6 cm  in the lower outer left breast.   RECOMMENDATION: Stereotactic core needle biopsy x1 of the left breast.   I have discussed the findings and recommendations with the patient. If applicable, a reminder letter will be sent to the patient regarding the next appointment.   BI-RADS CATEGORY  4: Suspicious.     Electronically Signed   By: Audie Pinto M.D.   On: 05/12/2022 14:08       Assessment and Plan:  Diagnoses and all orders for this visit:   Intraductal carcinoma in situ of left breast       After an extensive discussion with the patient, we will plan a left breast radioactive seed localized lumpectomy to try to achieve clear margins. The surgical procedure has been discussed with the patient.  Potential risks, benefits, alternative treatments, and expected outcomes have been explained.  All of the patient's questions at this time have been answered.  The likelihood of reaching the patient's treatment goal is good.  The patient understand the proposed surgical procedure and wishes to proceed.     Carlean Jews, MD  06/08/2022 3:56 PM

## 2022-06-08 NOTE — Progress Notes (Signed)
Radiation Oncology         ([phone]) [phone] ________________________________  Name: Sierra Summers        MRN: [id number]  Date of Service: 06/08/2022 DOB: [birth year]  EY:CXKGY, Rene Kocher, MD  Donnie Mesa, MD     REFERRING PHYSICIAN: Donnie Mesa, MD   DIAGNOSIS: The encounter diagnosis was Ductal carcinoma in situ (DCIS) of left breast.   HISTORY OF PRESENT ILLNESS: Sierra Summers is a 76 y.o. female seen in the multidisciplinary breast clinic for a new diagnosis of left breast cancer. The patient was noted to have screening detected calcifications in the lower outer quadrant. By diagnostic imaging these measured 6 mm, and she underwent a stereotactic biopsy on 05/30/22 that showed a intermediate grade DCIS that was ER/PR positive. She's seen today to discuss treatment recommendations of her cancer.     PREVIOUS RADIATION THERAPY:   1990: The patient received mantle field radiation for a history of Non Hodgkin's Lymphoma in [place]. No details are otherwise noted.    PAST 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   Hx of squamous cell carcinoma 09/28/2016   Hyperlipidemia    Hypertension    Non Hodgkin's lymphoma ([place]) 1989   Osteopenia    Phlebitis    Sciatica of left side        PAST SURGICAL HISTORY: Past Surgical History:  Procedure Laterality Date   BONE MARROW TRANSPLANT  1991   BREAST BIOPSY Left 05/30/2022   MM LT BREAST BX W LOC DEV 1ST LESION IMAGE BX SPEC STEREO GUIDE 05/30/2022 [facility]   BREAST CYST EXCISION Right    Laprascopy  1989 and 1992     FAMILY HISTORY:  Family History  Problem Relation Age of Onset   Heart disease Mother    Hyperlipidemia Mother    Hypertension Mother    Stroke Mother    Diabetes Father    Heart disease Father     Hyperlipidemia Father    Mental illness Father    Alcohol abuse Father    Hyperlipidemia Sister    Hypertension Sister    Cancer Maternal Grandmother    Heart disease Maternal Grandfather    Mental illness Paternal Grandmother    Mental illness Paternal Grandfather    Heart attack Sister    Stroke Sister    Dementia Sister    High blood pressure Sister    High Cholesterol Sister    Parkinson's disease Sister      SOCIAL HISTORY:  reports that she quit smoking about 38 years ago. Her smoking use included cigarettes. She has never used smokeless tobacco. She reports current alcohol use. She reports that she does not use drugs. The patient is divorced and lives in [place] at [place]. 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 They enjoy walking 3 miles a day and she keeps very active.   ALLERGIES: Sulfa antibiotics   MEDICATIONS:  Current Outpatient Medications  Medication Sig Dispense Refill   aspirin EC 81 MG tablet Take 81 mg by mouth as needed.      Black Pepper-Turmeric (TURMERIC CURCUMIN) 11-998 MG CAPS Take 1 capsule by mouth daily.     Coenzyme Q10 (CO Q 10) 100 MG CAPS Take 1 capsule  by mouth daily.     losartan (COZAAR) 50 MG tablet Take 1 tablet (50 mg total) by mouth daily. 90 tablet 3   LUTEIN PO Take 1 tablet by mouth daily.     Multiple Vitamin (MULTIVITAMIN) tablet Take 1 tablet by mouth daily.     Omega-3 Fatty Acids (OMEGA 3 PO) Take 1,280 mg by mouth daily.     OVER THE COUNTER MEDICATION Take 1 capsule by mouth daily. Cordyceps     OVER THE COUNTER MEDICATION Take 750 mg by mouth daily. Bacopa     simvastatin (ZOCOR) 40 MG tablet Take 0.5 tablets (20 mg total) by mouth daily at 6 PM. 45 tablet 1   traZODone (DESYREL) 50 MG tablet Take 1 tablet (50 mg total) by mouth at bedtime. (Patient taking differently: Take 25 mg by mouth at bedtime as needed for sleep.) 90 tablet 3   No current facility-administered medications for this visit.      REVIEW OF SYSTEMS: On review of systems, the patient reports that she is doing well overall. No breast specific complaints are noted.      PHYSICAL EXAM:  Wt Readings from Last 3 Encounters:  05/24/22 156 lb 3.2 oz (70.9 kg)  11/22/21 158 lb (71.7 kg)  05/12/21 155 lb 9.6 oz (70.6 kg)   Temp Readings from Last 3 Encounters:  05/24/22 97.7 F (36.5 C) (Temporal)  11/22/21 97.7 F (36.5 C)  05/12/21 97.6 F (36.4 C)   BP Readings from Last 3 Encounters:  05/24/22 136/68  11/22/21 136/68  05/12/21 140/82   Pulse Readings from Last 3 Encounters:  05/24/22 84  11/22/21 76  05/12/21 76    In general this is a well appearing caucasian female in no acute distress. She's alert and oriented x4 and appropriate throughout the examination. Cardiopulmonary assessment is negative for acute distress and she exhibits normal effort. Bilateral breast exam is deferred.    ECOG = 0  0 - Asymptomatic (Fully active, able to carry on all predisease activities without restriction)  1 - Symptomatic but completely ambulatory (Restricted in physically strenuous activity but ambulatory and able to carry out work of a light or sedentary nature. For example, light housework, office work)  2 - Symptomatic, <50% in bed during the day (Ambulatory and capable of all self care but unable to carry out any work activities. Up and about more than 50% of waking hours)  3 - Symptomatic, >50% in bed, but not bedbound (Capable of only limited self-care, confined to bed or chair 50% or more of waking hours)  4 - Bedbound (Completely disabled. Cannot carry on any self-care. Totally confined to bed or chair)  5 - Death   Eustace Pen MM, Creech RH, Tormey DC, et al. [phone]). "Toxicity and response criteria of the [facility]". [place] Oncol. 5 (6): 649-55    LABORATORY DATA:  Lab Results  Component Value Date   WBC 4.8 11/16/2021   HGB 13.5 11/16/2021   HCT 39 11/16/2021   MCV  98.3 08/08/2018   PLT 286 11/16/2021   Lab Results  Component Value Date   NA 141 05/17/2022   K 4.6 05/17/2022   CL 106 05/17/2022   CO2 22 05/17/2022   Lab Results  Component Value Date   ALT 19 11/16/2021   AST 24 11/16/2021   ALKPHOS 59 11/16/2021   BILITOT 0.7 08/08/2018      RADIOGRAPHY: MM LT BREAST BX W LOC DEV 1ST LESION IMAGE  BX SPEC STEREO GUIDE  Addendum Date: 06/01/2022   ADDENDUM REPORT: 06/01/2022 15:11 ADDENDUM: Pathology revealed DUCTAL CARCINOMA IN SITU, SOLID WITH APOCRINE FEATURES, INTERMEDIATE GRADE- NECROSIS: PRESENT, FOCAL. CALCIFICATIONS: PRESENT of the LEFT breast, lower outer quadrant (X clip). This was found to be concordant by Dr. Lovey Newcomer. Pathology results were discussed with the patient by telephone. The patient reported doing well after the biopsy with tenderness at the site. Post biopsy instructions and care were reviewed and questions were answered. The patient was encouraged to call [facility] for any additional concerns. Per patient request, the patient was referred to [facility] on June 08, 2022. Pathology results reported by Stacie Acres RN on 06/01/2022. Electronically Signed   By: Lovey Newcomer M.D.   On: 06/01/2022 15:11   Result Date: 06/01/2022 CLINICAL DATA:  Patient with indeterminate calcifications lower outer left breast. EXAM: LEFT BREAST STEREOTACTIC CORE NEEDLE BIOPSY COMPARISON:  Previous exam(s). FINDINGS: The patient and I discussed the procedure of stereotactic-guided biopsy including benefits and alternatives. We discussed the high likelihood of a successful procedure. We discussed the risks of the procedure including infection, bleeding, tissue injury, clip migration, and inadequate sampling. Informed written consent was given. The usual time out protocol was performed immediately prior to the procedure. Using sterile technique  and 1% Lidocaine as local anesthetic, under stereotactic guidance, a 9 gauge vacuum assisted device was used to perform core needle biopsy of calcifications lower outer left breast using a lateral approach. Specimen radiograph was performed showing calcifications. Specimens with calcifications are identified for pathology. Lesion quadrant: Lower outer quadrant At the conclusion of the procedure, X shaped tissue marker clip was deployed into the biopsy cavity. Follow-up 2-view mammogram was performed and dictated separately. IMPRESSION: Stereotactic-guided biopsy of left breast calcifications. No apparent complications. Electronically Signed: By: Lovey Newcomer M.D. On: 05/30/2022 11:35  MM CLIP PLACEMENT LEFT  Result Date: 05/30/2022 CLINICAL DATA:  Indeterminate calcifications lower outer left breast. EXAM: 3D DIAGNOSTIC LEFT MAMMOGRAM POST STEREOTACTIC BIOPSY COMPARISON:  Previous exam(s). FINDINGS: 3D Mammographic images were obtained following stereotactic guided biopsy of left breast calcifications. The biopsy marking clip is in expected position at the site of biopsy. IMPRESSION: Appropriate positioning of the X shaped biopsy marking clip at the site of biopsy in the lower outer left breast. Final Assessment: Post Procedure Mammograms for Marker Placement Electronically Signed   By: Lovey Newcomer M.D.   On: 05/30/2022 11:36  MM Digital Diagnostic Unilat L  Result Date: 05/12/2022 CLINICAL DATA:  76 year old female presenting as a recall from screening for possible left breast calcifications. EXAM: DIGITAL DIAGNOSTIC UNILATERAL LEFT MAMMOGRAM WITH CAD TECHNIQUE: Left digital diagnostic mammography was performed. COMPARISON:  Previous exam(s). ACR Breast Density Category c: The breast tissue is heterogeneously dense, which may obscure small masses. FINDINGS: Spot 2D magnification views and full true lateral 2D views of the left breast were performed for calcifications in the lower outer left breast.  Calcifications are difficult to image due to far posterior location. There is persistence of a faint group of calcifications measuring approximately 0.6 cm best seen on the true lateral 2 of 2 magnification view. No associated mass or distortion. No new findings elsewhere. IMPRESSION: Indeterminate calcifications spanning 0.6 cm in the lower outer left breast. RECOMMENDATION: Stereotactic core needle biopsy x1 of the left breast. I have discussed the findings and recommendations with the patient. If applicable, a reminder letter will be  sent to the patient regarding the next appointment. BI-RADS CATEGORY  4: Suspicious. Electronically Signed   By: Audie Pinto M.D.   On: 05/12/2022 14:08      IMPRESSION/PLAN: 1. Intermediate grade ER/PR positive DCIS of the left breast. Dr. Lisbeth Renshaw discusses the pathology findings and reviews the nature of early stage, non invasive left breast disease. The consensus from the breast conference includes breast conservation with lumpectomy. Dr. Lisbeth Renshaw also discusses cases in which radiation may be optional for favorable cases based on final pathology.  We will follow-up with her final results of surgery, but if recommended Dr. Lisbeth Renshaw would anticipate a course of 4 weeks of radiotherapy and we discussed the risks, benefits, short, and long term effects of radiotherapy, as well as the curative intent, if she needed to proceed. We will see her back a few weeks after surgery to discuss decision making.   2. Possible genetic predisposition to malignancy. The patient is a candidate for genetic testing. She will meet with our geneticist today in clinic.   In a visit lasting 60 minutes, greater than 50% of the time was spent face to face reviewing her case, as well as in preparation of, discussing, and coordinating the patient's care.  The above documentation reflects my direct findings during this shared patient visit. Please see the separate note by Dr. Lisbeth Renshaw on this date for the  remainder of the patient's plan of care.    Carola Rhine, [facility]    **Disclaimer: This note was dictated with voice recognition software. Similar sounding words can inadvertently be transcribed and this note may contain transcription errors which may not have been corrected upon publication of note.**

## 2022-06-08 NOTE — Progress Notes (Signed)
[place]   Telephone:(336) [phone] Fax:(336) [place] Note   Patient Care Team: Virgie Dad, MD as PCP - General (Internal Medicine) Otelia Sergeant, OD as Referring Physician Jari Pigg, MD as Consulting Physician (Dermatology) Mauro Kaufmann, RN as Oncology Nurse Navigator Rockwell Germany, RN as Oncology Nurse Navigator Truitt Merle, MD as Consulting Physician (Hematology) Kyung Rudd, MD as Consulting Physician (Radiation Oncology) Donnie Mesa, MD as Consulting Physician (General Surgery)  Date of Service:  06/08/2022   CHIEF COMPLAINTS/PURPOSE OF CONSULTATION:  Breast Cancer, ER+  REFERRING PHYSICIAN:  Breast cancer    ASSESSMENT & PLAN:  Sierra Summers is a 76 y.o. post-menopausal female with a history of NHL   1. Left breast DCIS, grade 2, ER+ /PR+ -I discussed her breast imaging and needle biopsy results with patient and her family members in great detail. -She is a candidate for breast conservation surgery. She has been seen by breast surgeon Dr. Georgette Dover, who recommends lumpectomy. -Her DCIS will be cured by complete surgical resection. Any form of adjuvant therapy is preventive. -Given her strongly positive/negative ER and PR, I do/not recommend antiestrogen therapy with tamoxifen, which decrease her risk of future breast cancer by ~40%.  We also discussed the data of low dose tamoxifen 5 mg daily for 3 years, instead of 20 mg daily for 5 years, which showed similar benefit. ---The potential side effects, which includes but not limited to, hot flash, skin and vaginal dryness, slightly increased risk of cardiovascular disease and cataract, small risk of thrombosis and endometrial cancer, were discussed with her in great details. Preventive strategies for thrombosis, such as being physically active, using compression stocks, avoid cigarette smoking, etc., were reviewed with her. I also recommend her to follow-up with her  gynecologist once a year, and watch for vaginal spotting or bleeding, as a clinically sign of endometrial cancer, etc. we again reviewed giving the low-dose tamoxifen, the risk of thrombosis and endometrial cancer is minimal.  She voiced good understanding, and agrees to proceed.  -Due to her previous radiation to non-Hodgkin's lymphoma, she is not a candidate for adjuvant radiation.  She was seen by Dr. Lisbeth Renshaw today.    PLAN:  -Patient will proceed with lumpectomy soon  -I recommend adjuvant tamoxifen for 3 years and she is interested -She is not a candidate for adjuvant radiation to her previous radiation history.   Oncology History  Ductal carcinoma in situ (DCIS) of left breast  05/30/2022 Cancer Staging   Staging form: Breast, AJCC 8th Edition - Clinical stage from 05/30/2022: Stage 0 (cTis (DCIS), cN0, cM0, G2, ER+, PR+, HER2: Not Assessed) - Signed by Truitt Merle, MD on 06/08/2022 Stage prefix: Initial diagnosis Histologic grading system: 3 grade system   06/03/2022 Initial Diagnosis   Ductal carcinoma in situ (DCIS) of left breast      HISTORY OF PRESENTING ILLNESS:  Sierra Summers 76 y.o. female is a here because of breast cancer. The patient was referred by breast center. The patient presents to the clinic today accompanied by sister.   She had routine screening mammography on 04/14/2022 showing abnormal calcifications in the left breast She underwent diagnostic mammography on May 12, 2022, which showed indeterminate calcifications spanning 0.6 cm in the lower outer left breast.  She underwent biopsy on May 30, 2022, which showed DCIS, intermediate grade, ER and PR strongly positive.    Today the patient notes she feels well. Pt state she has no  pain.Pt Takes she only take Asprin . She has a PMHx of non-Hodgkin lymphoma diagnosed in 1989, she underwent chemotherapy, mantle field radiation, and autologous bone marrow transplant in 1990.  She has been no evidence  of recurrence since then.  Socially, she is divorced.  [facility] [place] [facility] [place] [facility].  She remains physically very active.    GYN HISTORY  Menarchal: 11 LMP: 1990 Contraceptive: no HRT: no G2P0: She had an abortion and ectopic pregnancy.    REVIEW OF SYSTEMS:    Constitutional: Denies fevers, chills or abnormal night sweats Eyes: Denies blurriness of vision, double vision or watery eyes Ears, nose, mouth, throat, and face: Denies mucositis or sore throat Respiratory: Denies cough, dyspnea or wheezes Cardiovascular: Denies palpitation, chest discomfort or lower extremity swelling Gastrointestinal:  Denies nausea, heartburn or change in bowel habits Skin: Denies abnormal skin rashes Lymphatics: Denies new lymphadenopathy or easy bruising Neurological:Denies numbness, tingling or new weaknesses Behavioral/Psych: Mood is stable, no new changes  All other systems were reviewed with the patient and are negative.   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   Hx of squamous cell carcinoma 09/28/2016   Hyperlipidemia    Hypertension    Non Hodgkin's lymphoma ([place]) 1989   Osteopenia    Phlebitis    Sciatica of left side     SURGICAL HISTORY: Past Surgical History:  Procedure Laterality Date   BONE MARROW TRANSPLANT  1991   BREAST BIOPSY Left 05/30/2022   MM LT BREAST BX W LOC DEV 1ST LESION IMAGE BX SPEC STEREO GUIDE 05/30/2022 [facility]   BREAST CYST EXCISION Right    Laprascopy  1989 and 1992    SOCIAL HISTORY: Social History   Socioeconomic History   Marital status: Divorced    Spouse name: Not on file   Number of children: 0   Years of education: Not on file   Highest education level: Not on file  Occupational History    Employer: [employer]  Tobacco Use    Smoking status: Former    Packs/day: 0.50    Years: 10.00    Total pack years: 5.00    Types: Cigarettes    Quit date: 07/05/1983    Years since quitting: 38.9   Smokeless tobacco: Never   Tobacco comments:    Pt states she was a binge smoker  Vaping Use   Vaping Use: Never used  Substance and Sexual Activity   Alcohol use: Yes    Comment: 2-3 drinks a week   Drug use: No   Sexual activity: Not Currently    Partners: Male  Other Topics Concern   Not on file  Social History Narrative   Social History      Diet? n/a      Do you drink/eat things with caffeine? Mostly no       Marital status?                        n/a            What year were you married?      Do you live in a house, apartment, assisted living, condo, trailer, etc.? apartment      Is it one or  more stories? 3       How many persons live in your home? 1      Do you have any pets in your home? no      Highest level of education completed? Some college       Current or past profession: flight attendant      Do you exercise?                 yes                     Type & how often? Every day       Advanced Directives      Do you have a living will? yes      Do you have a DNR form?                                  If not, do you want to discuss one? Yes      Do you have signed POA/HPOA for forms? yes      Functional Status      Do you have difficulty bathing or dressing yourself? No       Do you have difficulty preparing food or eating? No       Do you have difficulty managing your medications? No       Do you have difficulty managing your finances? No       Do you have difficulty affording your medications? No       Social Determinants of Radio broadcast assistant Strain: Not on file  Food Insecurity: Not on file  Transportation Needs: Not on file  Physical Activity: Not on file  Stress: Not on file  Social Connections: Not on file  Intimate Partner Violence: Not on file    FAMILY  HISTORY: Family History  Problem Relation Age of Onset   Heart disease Mother    Hyperlipidemia Mother    Hypertension Mother    Stroke Mother    Diabetes Father    Heart disease Father    Hyperlipidemia Father    Mental illness Father    Alcohol abuse Father    Hyperlipidemia Sister    Hypertension Sister    Cancer Maternal Grandmother    Heart disease Maternal Grandfather    Mental illness Paternal Grandmother    Mental illness Paternal Grandfather    Heart attack Sister    Stroke Sister    Dementia Sister    High blood pressure Sister    High Cholesterol Sister    Parkinson's disease Sister     ALLERGIES:  is allergic to sulfa antibiotics.  MEDICATIONS:  Current Outpatient Medications  Medication Sig Dispense Refill   aspirin EC 81 MG tablet Take 81 mg by mouth as needed.      Black Pepper-Turmeric (TURMERIC CURCUMIN) 11-998 MG CAPS Take 1 capsule by mouth daily.     Coenzyme Q10 (CO Q 10) 100 MG CAPS Take 1 capsule by mouth daily.     losartan (COZAAR) 50 MG tablet Take 1 tablet (50 mg total) by mouth daily. 90 tablet 3   LUTEIN PO Take 1 tablet by mouth daily.     Multiple Vitamin (MULTIVITAMIN) tablet Take 1 tablet by mouth daily.     Omega-3 Fatty Acids (OMEGA 3 PO) Take 1,280 mg by mouth daily.     OVER THE COUNTER MEDICATION Take  1 capsule by mouth daily. Cordyceps     OVER THE COUNTER MEDICATION Take 750 mg by mouth daily. Bacopa     simvastatin (ZOCOR) 40 MG tablet Take 0.5 tablets (20 mg total) by mouth daily at 6 PM. 45 tablet 1   traZODone (DESYREL) 50 MG tablet Take 1 tablet (50 mg total) by mouth at bedtime. (Patient taking differently: Take 25 mg by mouth at bedtime as needed for sleep.) 90 tablet 3   No current facility-administered medications for this visit.    PHYSICAL EXAMINATION: ECOG PERFORMANCE STATUS: 0 - Asymptomatic  Vitals:   06/08/22 1244  BP: (!) 155/79  Pulse: 78  Resp: 16  Temp: 97.7 F (36.5 C)  SpO2: 99%   Filed Weights    06/08/22 1244  Weight: 155 lb (70.3 kg)    GENERAL:alert, no distress and comfortable SKIN: skin color, texture, turgor are normal, no rashes or significant lesions NECK: supple, thyroid normal size, non-tender, without nodularity LYMPH:  no palpable lymphadenopathy in the cervical, axillary (-) LUNGS: clear to auscultation and percussion with normal breathing effort(-) HEART: regular rate & rhythm and no murmurs and no lower extremity edema ABDOMEN:abdomen soft, non-tender(-) and normal bowel sounds(-) Musculoskeletal:no cyanosis of digits and no clubbing  NEURO: alert & oriented x 3 with fluent speech, no focal motor/sensory deficits BREAST:  No palpable mass, nodules or adenopathy bilaterally. Breast exam benign.  Left breast tender,bruise from biopsy  LABORATORY DATA:  I have reviewed the data as listed    Latest Ref Rng & Units 06/08/2022   12:08 PM 11/16/2021   12:00 AM 11/03/2020    3:00 AM  CBC  WBC 4.0 - 10.5 K/uL 5.9  4.8     5.6      Hemoglobin 12.0 - 15.0 g/dL 13.4  13.5     13.4      Hematocrit 36.0 - 46.0 % 38.5  39     39      Platelets 150 - 400 K/uL 235  286     284         This result is from an external source.       Latest Ref Rng & Units 06/08/2022   12:08 PM 05/17/2022   12:00 AM 11/16/2021   12:00 AM  CMP  Glucose 70 - 99 mg/dL 108     BUN 8 - 23 mg/dL _0 Creatinine 0.44 - 1.00 mg/dL 1.31  1.1     1.2      Sodium 135 - 145 mmol/L 140  141     144      Potassium 3.5 - 5.1 mmol/L 4.4  4.6     5.0      Chloride 98 - 111 mmol/L 108  106     109      CO2 22 - 32 mmol/L _1 Calcium 8.9 - 10.3 mg/dL 9.7  9.6     9.6      Total Protein 6.5 - 8.1 g/dL 7.2     Total Bilirubin 0.3 - 1.2 mg/dL 0.7     Alkaline Phos 38 - 126 U/L 56   59      AST 15 - 41 U/L 29   24      ALT 0 - 44 U/L 23   19         This  result is from an external source.     RADIOGRAPHIC STUDIES: I have personally reviewed the radiological images as listed  and agreed with the findings in the report. MM LT BREAST BX W LOC DEV 1ST LESION IMAGE BX SPEC STEREO GUIDE  Addendum Date: 06/01/2022   ADDENDUM REPORT: 06/01/2022 15:11 ADDENDUM: Pathology revealed DUCTAL CARCINOMA IN SITU, SOLID WITH APOCRINE FEATURES, INTERMEDIATE GRADE- NECROSIS: PRESENT, FOCAL. CALCIFICATIONS: PRESENT of the LEFT breast, lower outer quadrant (X clip). This was found to be concordant by Dr. Lovey Newcomer. Pathology results were discussed with the patient by telephone. The patient reported doing well after the biopsy with tenderness at the site. Post biopsy instructions and care were reviewed and questions were answered. The patient was encouraged to call [facility] for any additional concerns. Per patient request, the patient was referred to [facility] on June 08, 2022. Pathology results reported by Stacie Acres RN on 06/01/2022. Electronically Signed   By: Lovey Newcomer M.D.   On: 06/01/2022 15:11   Result Date: 06/01/2022 CLINICAL DATA:  Patient with indeterminate calcifications lower outer left breast. EXAM: LEFT BREAST STEREOTACTIC CORE NEEDLE BIOPSY COMPARISON:  Previous exam(s). FINDINGS: The patient and I discussed the procedure of stereotactic-guided biopsy including benefits and alternatives. We discussed the high likelihood of a successful procedure. We discussed the risks of the procedure including infection, bleeding, tissue injury, clip migration, and inadequate sampling. Informed written consent was given. The usual time out protocol was performed immediately prior to the procedure. Using sterile technique and 1% Lidocaine as local anesthetic, under stereotactic guidance, a 9 gauge vacuum assisted device was used to perform core needle biopsy of calcifications lower outer left breast using a lateral approach. Specimen radiograph was performed showing calcifications.  Specimens with calcifications are identified for pathology. Lesion quadrant: Lower outer quadrant At the conclusion of the procedure, X shaped tissue marker clip was deployed into the biopsy cavity. Follow-up 2-view mammogram was performed and dictated separately. IMPRESSION: Stereotactic-guided biopsy of left breast calcifications. No apparent complications. Electronically Signed: By: Lovey Newcomer M.D. On: 05/30/2022 11:35  MM CLIP PLACEMENT LEFT  Result Date: 05/30/2022 CLINICAL DATA:  Indeterminate calcifications lower outer left breast. EXAM: 3D DIAGNOSTIC LEFT MAMMOGRAM POST STEREOTACTIC BIOPSY COMPARISON:  Previous exam(s). FINDINGS: 3D Mammographic images were obtained following stereotactic guided biopsy of left breast calcifications. The biopsy marking clip is in expected position at the site of biopsy. IMPRESSION: Appropriate positioning of the X shaped biopsy marking clip at the site of biopsy in the lower outer left breast. Final Assessment: Post Procedure Mammograms for Marker Placement Electronically Signed   By: Lovey Newcomer M.D.   On: 05/30/2022 11:36  MM Digital Diagnostic Unilat L  Result Date: 05/12/2022 CLINICAL DATA:  76 year old female presenting as a recall from screening for possible left breast calcifications. EXAM: DIGITAL DIAGNOSTIC UNILATERAL LEFT MAMMOGRAM WITH CAD TECHNIQUE: Left digital diagnostic mammography was performed. COMPARISON:  Previous exam(s). ACR Breast Density Category c: The breast tissue is heterogeneously dense, which may obscure small masses. FINDINGS: Spot 2D magnification views and full true lateral 2D views of the left breast were performed for calcifications in the lower outer left breast. Calcifications are difficult to image due to far posterior location. There is persistence of a faint group of calcifications measuring approximately 0.6 cm best seen on the true lateral 2 of 2 magnification view. No associated mass or distortion. No new findings elsewhere.  IMPRESSION: Indeterminate calcifications spanning 0.6 cm in the lower outer left breast. RECOMMENDATION: Stereotactic core needle biopsy x1 of the left breast. I have discussed the findings and recommendations with the patient. If applicable, a reminder letter will be sent to the patient regarding the next appointment. BI-RADS CATEGORY  4: Suspicious. Electronically Signed   By: Audie Pinto M.D.   On: 05/12/2022 14:08    No orders of the defined types were placed in this encounter.   All questions were answered. The patient knows to call the clinic with any problems, questions or concerns. The total time spent in the appointment was 45 minutes.     Truitt Merle, MD 06/08/2022 2:29 PM  I, Audry Riles, am acting as scribe for Truitt Merle, MD.   I have reviewed the above documentation for accuracy and completeness, and I agree with the above.

## 2022-06-08 NOTE — Progress Notes (Signed)
[facility] Psychosocial Distress Screening Spiritual Care  Met with Sierra Summers and her sister in [facility] to introduce [facility] team/resources, reviewing distress screen per protocol.  The patient scored a 7 on the Psychosocial Distress Thermometer which indicates moderate distress. Also assessed for distress and other psychosocial needs.      06/08/2022    3:51 PM  ONCBCN DISTRESS SCREENING  Distress experienced in past week (1-10) 7  Emotional problem type Nervousness/Anxiety  Spiritual/Religous concerns type Relating to God  Referral to support programs Yes   Lysle Morales, counseling intern, met with patient and her sister in breast clinic. The patient shared when they were diagnosed with breast cancer they experienced anger. The patient shared they had gone through cancer treatment decades ago and thought they had already "paid [their] dues."   The patient reported, after receiving more information and their treatment plan, they feel at peace and no longer angry.   The patient has the counselor's contact information in case needs arise.   Follow up needed: No.  Lysle Morales,  Counseling Intern  [phone] [email or web address]

## 2022-06-08 NOTE — H&P (Signed)
Subjective    Chief Complaint: Breast Cancer   Breast Sierra Summers 06/08/22 - Sierra Summers/ Sierra Summers   History of Present Illness: Sierra Summers is a 76 y.o. female who is seen today as an office consultation at the request of Dr. Burr Medico for evaluation of Breast Cancer .   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  She has no family history of breast cancer.  She presents after recent screening mammogram showed a suspicious area of calcifications in the left lower outer quadrant.  The only previous breast problems included excision of a right breast cyst many years ago.  Further workup revealed a 6 mm area of calcifications in the lower outer quadrant.  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  ER/PR strongly positive.  She presents now to discuss her treatment options.  She is accompanied by her sister.  The patient is very active and appears much younger than stated age.       Review of Systems:     ROS  Constitutional: Denies fevers, chills or abnormal night sweats Eyes: Denies blurriness of vision, double vision or watery eyes Ears, nose, mouth, throat, and face: Denies mucositis or sore throat Respiratory: Denies cough, dyspnea or wheezes Cardiovascular: Denies palpitation, chest discomfort or lower extremity swelling Gastrointestinal:  Denies nausea, heartburn or change in bowel habits Skin: Denies abnormal skin rashes Lymphatics: Denies new lymphadenopathy or easy bruising Neurological:Denies numbness, tingling or new weaknesses Behavioral/Psych: Mood is stable, no new changes  All other systems were reviewed with the patient and are negative   Medical History: Past Medical History      Past Medical History:  Diagnosis Date   Anxiety     Arthritis     Asthma, unspecified asthma severity, unspecified whether complicated, unspecified whether persistent      Chronic kidney disease     History of cancer     Hyperlipidemia     Hypertension             Patient Active Problem List  Diagnosis   Ductal carcinoma in situ (DCIS) of left breast   Chronic kidney disease, stage 3 unspecified (CMS-HCC)   Essential hypertension   History of non-Hodgkin's lymphoma   Hx of bone marrow transplant (CMS-HCC)   Hyperlipidemia   Hx of skin cancer, basal cell      Past Surgical History       Past Surgical History:  Procedure Laterality Date   bone marrow transplant       e/o breast cyst            Allergies      Allergies  Allergen Reactions   Sulfa (Sulfonamide Antibiotics) Rash              Current Outpatient Medications on File Prior to Visit  Medication Sig Dispense Refill   aspirin 81 MG EC tablet Take by mouth       losartan (COZAAR) 50 MG tablet Take 50 mg by mouth once daily       multivitamin tablet Take 1 tablet by mouth once daily       omega-3 acid ethyl esters (LOVAZA) 1 gram capsule Take 2 g by mouth 2 (two) times daily       simvastatin (ZOCOR) 40 MG tablet Take by mouth       TURMERIC ORAL Take by mouth  No current facility-administered medications on file prior to visit.      Family History       Family History  Problem Relation Age of Onset   Stroke Mother     Hyperlipidemia (Elevated cholesterol) Mother     Hyperlipidemia (Elevated cholesterol) Father     Diabetes Father     Hyperlipidemia (Elevated cholesterol) Sister          Social History        Tobacco Use  Smoking Status Former   Types: Cigarettes   Quit date: 1992   Years since quitting: 31.9  Smokeless Tobacco Never      Social History  Social History         Socioeconomic History   Marital status: Legally Separated  Tobacco Use   Smoking status: Former      Types: Cigarettes      Quit date: 1992      Years since quitting: 31.9   Smokeless tobacco: Never        Objective:      Physical Exam    Constitutional:  WDWN in  NAD, conversant, no obvious deformities; lying in bed comfortably Eyes:  Pupils equal, round; sclera anicteric; moist conjunctiva; no lid lag HENT:  Oral mucosa moist; good dentition  Neck:  No masses palpated, trachea midline; no thyromegaly Lungs:  CTA bilaterally; normal respiratory effort Breasts:  symmetric, no nipple changes; no palpable masses or lymphadenopathy on either side; slight left lateral breast ecchymosis from her biopsy.  No palpable hematoma. CV:  Regular rate and rhythm; no murmurs; extremities well-perfused with no edema Abd:  +bowel sounds, soft, non-tender, no palpable organomegaly; no palpable hernias Musc:  Normal gait; no apparent clubbing or cyanosis in extremities Lymphatic:  No palpable cervical or axillary lymphadenopathy Skin:  Warm, dry; no sign of jaundice Psychiatric - alert and oriented x 4; calm mood and affect     Labs, Imaging and Diagnostic Testing:    Diagnosis Breast, left, needle core biopsy, lower outer - DUCTAL CARCINOMA IN SITU, SOLID WITH APOCRINE FEATURES, INTERMEDIATE GRADE - NECROSIS: PRESENT, FOCAL - CALCIFICATIONS: PRESENT - DCIS LENGTH: 0.6 CM CM NOTE: DR. Arby Barrette REVIEWED THE CASE AND CONCURS WITH THE INTERPRETATION. A BREAST PROGNOSTIC PROFILE (ER AND PR) IS PENDING AND WILL BE REPORTED IN AN ADDENDUM. [facility] WAS NOTIFIED ON 05/31/2022. Tilford Pillar M.D. Pathologist, Electronic Signature (Case signed 05/31/2022)   PROGNOSTIC INDICATORS Results: IMMUNOHISTOCHEMICAL AND MORPHOMETRIC ANALYSIS PERFORMED MANUALLY Estrogen Receptor: 100%, POSITIVE, STRONG STAINING INTENSITY Progesterone Receptor: 90%, POSITIVE, STRONG STAINING INTENSITY REFERENCE RANGE ESTROGEN RECEPTOR NEGATIVE 0% POSITIVE =>1% REFERENCE RANGE PROGESTERONE RECEPTOR NEGATIVE 0% POSITIVE =>1% All controls stained appropriately Casimer Lanius MD Pathologist, Electronic Signature ( Signed 06/01/2022)   CLINICAL DATA:  Screening.    EXAM: DIGITAL SCREENING BILATERAL MAMMOGRAM WITH TOMOSYNTHESIS AND CAD   TECHNIQUE: Bilateral screening digital craniocaudal and mediolateral oblique mammograms were obtained. Bilateral screening digital breast tomosynthesis was performed. The images were evaluated with computer-aided detection.   COMPARISON:  Previous exam(s).   ACR Breast Density Category c: The breast tissue is heterogeneously dense, which may obscure small masses.   FINDINGS: In the left breast, calcifications warrant further evaluation. In the right breast, no findings suspicious for malignancy.   IMPRESSION: Further evaluation is suggested for calcifications in the left breast.   RECOMMENDATION: Diagnostic mammogram of the left breast. (Code:FI-[id number])   The patient will be contacted regarding the findings, and additional imaging will be scheduled.   BI-RADS  CATEGORY  0: Incomplete. Need additional imaging evaluation and/or prior mammograms for comparison.     Electronically Signed   By: Margarette Canada M.D.   On: 04/14/2022 13:33   CLINICAL DATA:  76 year old female presenting as a recall from screening for possible left breast calcifications.   EXAM: DIGITAL DIAGNOSTIC UNILATERAL LEFT MAMMOGRAM WITH CAD   TECHNIQUE: Left digital diagnostic mammography was performed.   COMPARISON:  Previous exam(s).   ACR Breast Density Category c: The breast tissue is heterogeneously dense, which may obscure small masses.   FINDINGS: Spot 2D magnification views and full true lateral 2D views of the left breast were performed for calcifications in the lower outer left breast. Calcifications are difficult to image due to far posterior location. There is persistence of a faint group of calcifications measuring approximately 0.6 cm best seen on the true lateral 2 of 2 magnification view. No associated mass or distortion. No new findings elsewhere.   IMPRESSION: Indeterminate calcifications spanning 0.6 cm  in the lower outer left breast.   RECOMMENDATION: Stereotactic core needle biopsy x1 of the left breast.   I have discussed the findings and recommendations with the patient. If applicable, a reminder letter will be sent to the patient regarding the next appointment.   BI-RADS CATEGORY  4: Suspicious.     Electronically Signed   By: Audie Pinto M.D.   On: 05/12/2022 14:08       Assessment and Plan:  Diagnoses and all orders for this visit:   Intraductal carcinoma in situ of left breast       After an extensive discussion with the patient, we will plan a left breast radioactive seed localized lumpectomy to try to achieve clear margins. The surgical procedure has been discussed with the patient.  Potential risks, benefits, alternative treatments, and expected outcomes have been explained.  All of the patient's questions at this time have been answered.  The likelihood of reaching the patient's treatment goal is good.  The patient understand the proposed surgical procedure and wishes to proceed.     Carlean Jews, MD  06/08/2022 3:56 PM

## 2022-06-08 NOTE — Telephone Encounter (Signed)
Sierra Summers was seen by a genetic counselor during the breast cancer multidisciplinary clinic on 06/08/2022. She reports no family history of cancer. She does not meet NCCN criteria for genetic testing at this time. She was still offered genetic counseling and testing but declined. We encourage her to contact us if there are any changes to her personal or family history of cancer. If she meets NCCN criteria based on the updated personal/family history, she would be recommended to have genetic counseling and testing.   Lucille Passy, MS, [facility] Genetic Counselor [email or web address] (P) [phone]

## 2022-06-13 ENCOUNTER — Telehealth: Payer: Self-pay | Admitting: *Deleted

## 2022-06-13 ENCOUNTER — Other Ambulatory Visit: Payer: Self-pay | Admitting: Surgery

## 2022-06-13 ENCOUNTER — Encounter: Payer: Self-pay | Admitting: *Deleted

## 2022-06-13 DIAGNOSIS — D0512 Intraductal carcinoma in situ of left breast: Secondary | ICD-10-CM

## 2022-06-13 NOTE — Telephone Encounter (Signed)
Spoke to pt concerning [place] from 12/6. Denies questions or concerns regarding dx or treatment care plan. Discussed next steps and appt to see Dr. Burr Medico after surgery. Received verbal understanding. Encourage pt to call with needs.

## 2022-06-16 NOTE — Progress Notes (Signed)
Surgical Instructions    Your procedure is scheduled on Friday December 22nd.  Report to [facility] Main Entrance "A" at 8 A.M., then check in with the Admitting office.  Call this number if you have problems the morning of surgery:  [phone]   If you have any questions prior to your surgery date call [phone]: Open Monday-Friday 8am-4pm If you experience any cold or flu symptoms such as cough, fever, chills, shortness of breath, etc. between now and your scheduled surgery, please notify us at the above number     Remember:  Do not eat after midnight the night before your surgery  You may drink clear liquids until 7am the morning of your surgery.   Clear liquids allowed are: Water, Non-Citrus Juices (without pulp), Carbonated Beverages, Clear Tea, Black Coffee ONLY (NO MILK, CREAM OR POWDERED CREAMER of any kind), and Gatorade    Take these medicines the morning of surgery with A SIP OF WATER: NONE   Follow your surgeon's instructions on when to stop Aspirin.  If no instructions were given by your surgeon then you will need to call the office to get those instructions.     As of today, STOP taking any Aspirin (unless otherwise instructed by your surgeon) Aleve, Naproxen, Ibuprofen, Motrin, Advil, Goody's, BC's, all herbal medications, fish oil, and all vitamins.           Do not wear jewelry or makeup. Do not wear lotions, powders, perfumes or deodorant. Do not shave 48 hours prior to surgery.   Do not bring valuables to the hospital. Do not wear nail polish, gel polish, artificial nails, or any other type of covering on natural nails (fingers and toes) If you have artificial nails or gel coating that need to be removed by a nail salon, please have this removed prior to surgery. Artificial nails or gel coating may interfere with anesthesia's ability to adequately monitor your vital signs.  [facility] is not responsible for any belongings or valuables.    Do NOT Smoke  (Tobacco/Vaping)  24 hours prior to your procedure  If you use a CPAP at night, you may bring your mask for your overnight stay.   Contacts, glasses, hearing aids, dentures or partials may not be worn into surgery, please bring cases for these belongings   For patients admitted to the hospital, discharge time will be determined by your treatment team.   Patients discharged the day of surgery will not be allowed to drive home, and someone needs to stay with them for 24 hours.   SURGICAL WAITING ROOM VISITATION Patients having surgery or a procedure may have no more than 2 support people in the waiting area - these visitors may rotate.   Children under the age of 38 must have an adult with them who is not the patient. If the patient needs to stay at the hospital during part of their recovery, the visitor guidelines for inpatient rooms apply. Pre-op nurse will coordinate an appropriate time for 1 support person to accompany patient in pre-op.  This support person may not rotate.   Please refer to [email or web address] for the visitor guidelines for Inpatients (after your surgery is over and you are in a regular room).    Special instructions:    Oral Hygiene is also important to reduce your risk of infection.  Remember - BRUSH YOUR TEETH THE MORNING OF SURGERY WITH YOUR REGULAR TOOTHPASTE   [place]- Preparing For Surgery  Before surgery, you can play  an important role. Because skin is not sterile, your skin needs to be as free of germs as possible. You can reduce the number of germs on your skin by washing with CHG (chlorahexidine gluconate) Soap before surgery.  CHG is an antiseptic cleaner which kills germs and bonds with the skin to continue killing germs even after washing.     Please do not use if you have an allergy to CHG or antibacterial soaps. If your skin becomes reddened/irritated stop using the CHG.  Do not shave (including  legs and underarms) for at least 48 hours prior to first CHG shower. It is OK to shave your face.  Please follow these instructions carefully.     Shower the NIGHT BEFORE SURGERY and the MORNING OF SURGERY with CHG Soap.   If you chose to wash your hair, wash your hair first as usual with your normal shampoo. After you shampoo, rinse your hair and body thoroughly to remove the shampoo.  Then [employer] and genitals (private parts) with your normal soap and rinse thoroughly to remove soap.  After that Use CHG Soap as you would any other liquid soap. You can apply CHG directly to the skin and wash gently with a scrungie or a clean washcloth.   Apply the CHG Soap to your body ONLY FROM THE NECK DOWN.  Do not use on open wounds or open sores. Avoid contact with your eyes, ears, mouth and genitals (private parts). Wash Face and genitals (private parts)  with your normal soap.   Wash thoroughly, paying special attention to the area where your surgery will be performed.  Thoroughly rinse your body with warm water from the neck down.  DO NOT shower/wash with your normal soap after using and rinsing off the CHG Soap.  Pat yourself dry with a CLEAN TOWEL.  Wear CLEAN PAJAMAS to bed the night before surgery  Place CLEAN SHEETS on your bed the night before your surgery  DO NOT SLEEP WITH PETS.   Day of Surgery:  Take a shower with CHG soap. Wear Clean/Comfortable clothing the morning of surgery Do not apply any deodorants/lotions.   Remember to brush your teeth WITH YOUR REGULAR TOOTHPASTE.    If you received a COVID test during your pre-op visit, it is requested that you wear a mask when out in public, stay away from anyone that may not be feeling well, and notify your surgeon if you develop symptoms. If you have been in contact with anyone that has tested positive in the last 10 days, please notify your surgeon.    Please read over the following fact sheets that you were given.

## 2022-06-17 ENCOUNTER — Other Ambulatory Visit: Payer: Self-pay

## 2022-06-17 ENCOUNTER — Encounter (HOSPITAL_COMMUNITY)
Admission: RE | Admit: 2022-06-17 | Discharge: 2022-06-17 | Disposition: A | Discharge status: Home / Self Care | Payer: Medicare Other | Source: Ambulatory Visit | Attending: Surgery | Admitting: Surgery

## 2022-06-17 ENCOUNTER — Encounter (HOSPITAL_COMMUNITY): Payer: Self-pay

## 2022-06-17 ENCOUNTER — Other Ambulatory Visit (HOSPITAL_COMMUNITY): Payer: Medicare Other

## 2022-06-17 VITALS — BP 158/58 | HR 80 | Temp 97.9°F | Resp 16 | Ht 66.0 in | Wt 156.0 lb

## 2022-06-17 DIAGNOSIS — Z87891 Personal history of nicotine dependence: Secondary | ICD-10-CM | POA: Insufficient documentation

## 2022-06-17 DIAGNOSIS — D0512 Intraductal carcinoma in situ of left breast: Secondary | ICD-10-CM | POA: Diagnosis not present

## 2022-06-17 DIAGNOSIS — I129 Hypertensive chronic kidney disease with stage 1 through stage 4 chronic kidney disease, or unspecified chronic kidney disease: Secondary | ICD-10-CM | POA: Insufficient documentation

## 2022-06-17 DIAGNOSIS — Z01810 Encounter for preprocedural cardiovascular examination: Secondary | ICD-10-CM | POA: Diagnosis present

## 2022-06-17 DIAGNOSIS — N183 Chronic kidney disease, stage 3 unspecified: Secondary | ICD-10-CM | POA: Insufficient documentation

## 2022-06-17 DIAGNOSIS — Z01818 Encounter for other preprocedural examination: Secondary | ICD-10-CM

## 2022-06-17 HISTORY — DX: Unspecified asthma, uncomplicated: J45.909

## 2022-06-17 HISTORY — DX: Other complications of anesthesia, initial encounter: T88.59XA

## 2022-06-17 NOTE — Progress Notes (Signed)
Anesthesia APP Evaluation:  Case: [id number] Date/Time: 06/24/22 0945   Procedure: LEFT BREAST LUMPECTOMY WITH RADIOACTIVE SEED LOCALIZATION (Left) - LMA   Anesthesia type: General   Pre-op diagnosis: LEFT BREAST DUCTAL CARCINOMA IN SITU   Location: [facility] OR ROOM 08 / [facility]   Surgeons: Donnie Mesa, MD       DISCUSSION: Patient is a 76 year old female scheduled for the above procedure.  History includes former smoker (quit /1/85), murmur (see below), HTN, CKD (followed by PCP), non-Hodgkin's lymphoma (1989; s/p exploratory surgeries x2, chemoradiation, bone marrow transplant 1991; no longer followed by oncology), skin cancer (BCC, SCC), exercise induced asthma, left breast DCIS (05/30/22).  Reported agitation with anesthesia in the past.  She reported being told she had a murmur as a teenager/young adult. There has not been mention of a murmur in the recent past. If she had an echo it would have been done when she was undergoing treatment for non-Hodgkin's lymphoma between 1989-1991 and is not aware of any abnormalities. She is very active. She resides at [employer] and plays pickle ball 3-4 times a week and walks 3 miles 6 days/week.  She does not get chest pain, shortness of breath, syncope, palpitations, edema.  She has had palpitations in the past but improved after she cut out caffeine for the most part.  I did not hear a definite murmur on exam.  She did have occasional ectopic beats but underlying regular rhythm was regular.  EKG showed sinus rhythm with PACs, possible anterior infarct, age undetermined. She is asymptomatic of her PACs. Alcohol intake is documented as only 2-3 drinks/week.  She had CBC and [facility]08[facility].   RSL scheduled for 06/23/22 at 1:00 PM. ASA is on hold. Anesthesia team to evaluate on the day of surgery.   VS: BP (!) 158/58   Pulse 80   Temp 36.6 C   Resp 16   Ht '5\' 6"'$  (1.676 m)   Wt 70.8 kg   SpO2 100%   BMI 25.18 kg/m  patient and provider wore face mask during evaluation. Lungs clear anterior. Heart regular with occasional ectopic beats (PACs on EKG). No definite murmur heard. Denied LE edema.    PROVIDERS: Virgie Dad, MD is PCP  Truitt Merle, MD is Baker Pierini, MD is RAD-ONC   LABS: Most recent lab results in [facility] include: Lab Results  Component Value Date   WBC 5.9 06/08/2022   HGB 13.4 06/08/2022   HCT 38.5 06/08/2022   PLT 235 06/08/2022   GLUCOSE 108 (H) 06/08/2022   ALT 23 06/08/2022   AST 29 06/08/2022   NA 140 06/08/2022   K 4.4 06/08/2022   CL 108 06/08/2022   CREATININE 1.31 (H) 06/08/2022   BUN 22 06/08/2022   CO2 25 06/08/2022   TSH 5.78 11/16/2021    EKG: 06/17/22: Sinus rhythm with Premature atrial complexes Possible Anterior infarct , age undetermined Abnormal ECG - R wave progression is similar to 05/09/17 tracing.    CV: N/A   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   Hyperlipidemia    Hypertension  Non Hodgkin's lymphoma ([place]) 1989   Osteopenia    Phlebitis    Sciatica of left side     Past Surgical History:  Procedure Laterality Date   BONE MARROW TRANSPLANT  1991   BREAST BIOPSY Left 05/30/2022   MM LT BREAST BX W LOC DEV 1ST LESION IMAGE BX SPEC STEREO GUIDE 05/30/2022 [facility] MAMMOGRAPHY   BREAST CYST EXCISION Right    Laprascopy  1989 and 1992    MEDICATIONS:  aspirin EC 81 MG tablet   Black Pepper-Turmeric (TURMERIC CURCUMIN) 11-998 MG CAPS   Coenzyme Q10 (CO Q 10) 100 MG CAPS   losartan (COZAAR) 50 MG tablet   LUTEIN PO   Multiple Vitamin (MULTIVITAMIN) tablet   Omega-3 Fatty Acids (OMEGA 3 PO)   OVER THE COUNTER MEDICATION   OVER THE COUNTER MEDICATION    simvastatin (ZOCOR) 40 MG tablet   traZODone (DESYREL) 50 MG tablet   No current facility-administered medications for this encounter.    Myra Gianotti, PA-C Surgical Short Stay/Anesthesiology [facility] Phone [phone] [facility] Phone [phone] 06/18/2022 12:20 AM

## 2022-06-17 NOTE — Progress Notes (Signed)
PCP - Anjali L.Lyndel Safe MD Cardiologist - none  PPM/ICD - denies Device Orders -  Rep Notified -   Chest x-ray -  EKG - 06/17/22 Stress Test - 1991-[facility] during lymphoma treatment ECHO - none Cardiac Cath - none  Sleep Study - none CPAP -   Fasting Blood Sugar - na Checks Blood Sugar _____ times a day  Last dose of GLP1 agonist-  na GLP1 instructions:   Blood Thinner Instructions:na Aspirin Instructions:  ERAS Protcol -clear liquids until 0700 PRE-SURGERY Ensure or G2-   COVID TEST- na   Anesthesia review:   Patient denies shortness of breath, fever, cough and chest pain at PAT appointment   All instructions explained to the patient, with a verbal understanding of the material. Patient agrees to go over the instructions while at home for a better understanding. Patient also instructed to wear a mask when out in public prior to surgery. The opportunity to ask questions was provided.

## 2022-06-18 NOTE — Anesthesia Preprocedure Evaluation (Addendum)
Anesthesia Evaluation  Patient identified by MRN, date of birth, ID band Patient awake    Reviewed: Allergy & Precautions, NPO status , Patient's Chart, lab work & pertinent test results  History of Anesthesia Complications (+) Emergence Delirium and history of anesthetic complications (remote hx agitation after anesthesia 30 years ago)  Airway Mallampati: II  TM Distance: >3 FB Neck ROM: Full    Dental no notable dental hx.    Pulmonary asthma (exercise induced) , former smoker Former smoker quit 1985 Snores at night, has never had sleep study  Asthma- only rare use of inhaler    Pulmonary exam normal breath sounds clear to auscultation       Cardiovascular METS: 5 - 7 Mets hypertension (169/86, normally 130s SBP), Pt. on medications Normal cardiovascular exam Rhythm:Regular Rate:Normal     Neuro/Psych negative neurological ROS  negative psych ROS   GI/Hepatic negative GI ROS, Neg liver ROS,,,  Endo/Other  negative endocrine ROS    Renal/GU Renal diseaseCKD 3, Cr 1.3  negative genitourinary   Musculoskeletal negative musculoskeletal ROS (+)    Abdominal   Peds  Hematology  (+) Blood dyscrasia Hx non-hodgkins lymphoma remote past 1990   Anesthesia Other Findings   Reproductive/Obstetrics negative OB ROS                              Anesthesia Physical Anesthesia Plan  ASA: 3  Anesthesia Plan: General   Post-op Pain Management: Tylenol PO (pre-op)*   Induction: Intravenous  PONV Risk Score and Plan: 4 or greater and Ondansetron, Dexamethasone and Treatment may vary due to age or medical condition  Airway Management Planned: LMA  Additional Equipment:   Intra-op Plan:   Post-operative Plan: Extubation in OR  Informed Consent: I have reviewed the patients History and Physical, chart, labs and discussed the procedure including the risks, benefits and alternatives for the  proposed anesthesia with the patient or authorized representative who has indicated his/her understanding and acceptance.     Dental advisory given  Plan Discussed with: CRNA  Anesthesia Plan Comments:         Anesthesia Quick Evaluation

## 2022-06-23 ENCOUNTER — Ambulatory Visit
Admission: RE | Admit: 2022-06-23 | Discharge: 2022-06-23 | Disposition: A | Discharge status: Home / Self Care | Payer: Medicare Other | Source: Ambulatory Visit | Attending: Surgery | Admitting: Surgery

## 2022-06-23 DIAGNOSIS — D0512 Intraductal carcinoma in situ of left breast: Secondary | ICD-10-CM

## 2022-06-23 HISTORY — PX: BREAST BIOPSY: SHX20

## 2022-06-24 ENCOUNTER — Other Ambulatory Visit: Payer: Self-pay

## 2022-06-24 ENCOUNTER — Encounter (HOSPITAL_COMMUNITY): Payer: Self-pay | Admitting: Surgery

## 2022-06-24 ENCOUNTER — Ambulatory Visit (HOSPITAL_BASED_OUTPATIENT_CLINIC_OR_DEPARTMENT_OTHER): Payer: Medicare Other | Admitting: Certified Registered"

## 2022-06-24 ENCOUNTER — Ambulatory Visit
Admission: RE | Admit: 2022-06-24 | Discharge: 2022-06-24 | Disposition: A | Discharge status: Home / Self Care | Payer: Medicare Other | Source: Ambulatory Visit | Attending: Surgery | Admitting: Surgery

## 2022-06-24 ENCOUNTER — Encounter (HOSPITAL_COMMUNITY)
Admission: RE | Disposition: A | Discharge status: Home / Self Care | Payer: Self-pay | Source: Ambulatory Visit | Attending: Surgery

## 2022-06-24 ENCOUNTER — Ambulatory Visit (HOSPITAL_COMMUNITY)
Admission: RE | Admit: 2022-06-24 | Discharge: 2022-06-24 | Disposition: A | Discharge status: Home / Self Care | Payer: Medicare Other | Source: Ambulatory Visit | Attending: Surgery | Admitting: Surgery

## 2022-06-24 ENCOUNTER — Ambulatory Visit (HOSPITAL_COMMUNITY): Payer: Medicare Other | Admitting: Vascular Surgery

## 2022-06-24 DIAGNOSIS — N183 Chronic kidney disease, stage 3 unspecified: Secondary | ICD-10-CM

## 2022-06-24 DIAGNOSIS — Z87891 Personal history of nicotine dependence: Secondary | ICD-10-CM | POA: Insufficient documentation

## 2022-06-24 DIAGNOSIS — R0683 Snoring: Secondary | ICD-10-CM | POA: Insufficient documentation

## 2022-06-24 DIAGNOSIS — Z170 Estrogen receptor positive status [ER+]: Secondary | ICD-10-CM | POA: Diagnosis not present

## 2022-06-24 DIAGNOSIS — J45998 Other asthma: Secondary | ICD-10-CM | POA: Diagnosis not present

## 2022-06-24 DIAGNOSIS — Z8572 Personal history of non-Hodgkin lymphomas: Secondary | ICD-10-CM | POA: Diagnosis not present

## 2022-06-24 DIAGNOSIS — Z923 Personal history of irradiation: Secondary | ICD-10-CM | POA: Diagnosis not present

## 2022-06-24 DIAGNOSIS — I129 Hypertensive chronic kidney disease with stage 1 through stage 4 chronic kidney disease, or unspecified chronic kidney disease: Secondary | ICD-10-CM | POA: Insufficient documentation

## 2022-06-24 DIAGNOSIS — J45990 Exercise induced bronchospasm: Secondary | ICD-10-CM | POA: Insufficient documentation

## 2022-06-24 DIAGNOSIS — Z9481 Bone marrow transplant status: Secondary | ICD-10-CM | POA: Insufficient documentation

## 2022-06-24 DIAGNOSIS — D0512 Intraductal carcinoma in situ of left breast: Secondary | ICD-10-CM

## 2022-06-24 HISTORY — PX: BREAST LUMPECTOMY WITH RADIOACTIVE SEED LOCALIZATION: SHX6424

## 2022-06-24 SURGERY — BREAST LUMPECTOMY WITH RADIOACTIVE SEED LOCALIZATION
Anesthesia: General | Site: Breast | Laterality: Left

## 2022-06-24 MED ORDER — MIDAZOLAM HCL 2 MG/2ML IJ SOLN
INTRAMUSCULAR | Status: DC | PRN
  Administered 2022-06-24 (×2): 1 mg via INTRAVENOUS

## 2022-06-24 MED ORDER — FENTANYL CITRATE (PF) 250 MCG/5ML IJ SOLN
INTRAMUSCULAR | Status: AC
  Filled 2022-06-24: qty 5

## 2022-06-24 MED ORDER — ONDANSETRON HCL 4 MG/2ML IJ SOLN
INTRAMUSCULAR | Status: DC | PRN
  Administered 2022-06-24: 4 mg via INTRAVENOUS

## 2022-06-24 MED ORDER — PHENYLEPHRINE 80 MCG/ML (10ML) SYRINGE FOR IV PUSH (FOR BLOOD PRESSURE SUPPORT)
PREFILLED_SYRINGE | INTRAVENOUS | Status: AC
  Filled 2022-06-24: qty 10

## 2022-06-24 MED ORDER — CHLORHEXIDINE GLUCONATE CLOTH 2 % EX PADS: 6.0000 | MEDICATED_PAD | Freq: Once | CUTANEOUS | Status: DC

## 2022-06-24 MED ORDER — 0.9 % SODIUM CHLORIDE (POUR BTL) OPTIME
TOPICAL | Status: DC | PRN
  Administered 2022-06-24: 1000 mL

## 2022-06-24 MED ORDER — ONDANSETRON HCL 4 MG/2ML IJ SOLN
INTRAMUSCULAR | Status: AC
  Filled 2022-06-24: qty 2

## 2022-06-24 MED ORDER — EPHEDRINE 5 MG/ML INJ
INTRAVENOUS | Status: AC
  Filled 2022-06-24: qty 5

## 2022-06-24 MED ORDER — FENTANYL CITRATE (PF) 250 MCG/5ML IJ SOLN
INTRAMUSCULAR | Status: DC | PRN
  Administered 2022-06-24: 25 ug via INTRAVENOUS
  Administered 2022-06-24: 50 ug via INTRAVENOUS

## 2022-06-24 MED ORDER — BUPIVACAINE-EPINEPHRINE (PF) 0.5% -1:200000 IJ SOLN
INTRAMUSCULAR | Status: AC
  Filled 2022-06-24: qty 30

## 2022-06-24 MED ORDER — CEFAZOLIN SODIUM-DEXTROSE 2-4 GM/100ML-% IV SOLN
2.0000 g | INTRAVENOUS | Status: AC
  Administered 2022-06-24: 2 g via INTRAVENOUS
  Filled 2022-06-24: qty 100

## 2022-06-24 MED ORDER — DEXAMETHASONE SODIUM PHOSPHATE 10 MG/ML IJ SOLN
INTRAMUSCULAR | Status: AC
  Filled 2022-06-24: qty 1

## 2022-06-24 MED ORDER — BUPIVACAINE-EPINEPHRINE (PF) 0.5% -1:200000 IJ SOLN
INTRAMUSCULAR | Status: DC | PRN
  Administered 2022-06-24: 10 mL

## 2022-06-24 MED ORDER — MIDAZOLAM HCL 2 MG/2ML IJ SOLN
INTRAMUSCULAR | Status: AC
  Filled 2022-06-24: qty 2

## 2022-06-24 MED ORDER — ACETAMINOPHEN 500 MG PO TABS
1000.0000 mg | ORAL_TABLET | ORAL | Status: AC
  Administered 2022-06-24: 1000 mg via ORAL
  Filled 2022-06-24: qty 2

## 2022-06-24 MED ORDER — CHLORHEXIDINE GLUCONATE 0.12 % MT SOLN
15.0000 mL | Freq: Once | OROMUCOSAL | Status: AC
  Administered 2022-06-24: 15 mL via OROMUCOSAL
  Filled 2022-06-24: qty 15

## 2022-06-24 MED ORDER — DEXAMETHASONE SODIUM PHOSPHATE 10 MG/ML IJ SOLN
INTRAMUSCULAR | Status: DC | PRN
  Administered 2022-06-24 (×2): 10 mg via INTRAVENOUS

## 2022-06-24 MED ORDER — BUPIVACAINE HCL (PF) 0.25 % IJ SOLN
INTRAMUSCULAR | Status: AC
  Filled 2022-06-24: qty 30

## 2022-06-24 MED ORDER — PROPOFOL 10 MG/ML IV BOLUS
INTRAVENOUS | Status: AC
  Filled 2022-06-24: qty 20

## 2022-06-24 MED ORDER — LIDOCAINE 2% (20 MG/ML) 5 ML SYRINGE
INTRAMUSCULAR | Status: DC | PRN
  Administered 2022-06-24: 60 mg via INTRAVENOUS

## 2022-06-24 MED ORDER — LACTATED RINGERS IV SOLN: INTRAVENOUS | Status: DC

## 2022-06-24 MED ORDER — PHENYLEPHRINE 80 MCG/ML (10ML) SYRINGE FOR IV PUSH (FOR BLOOD PRESSURE SUPPORT)
PREFILLED_SYRINGE | INTRAVENOUS | Status: DC | PRN
  Administered 2022-06-24: 40 ug via INTRAVENOUS
  Administered 2022-06-24: 80 ug via INTRAVENOUS
  Administered 2022-06-24 (×2): 40 ug via INTRAVENOUS
  Administered 2022-06-24: 80 ug via INTRAVENOUS

## 2022-06-24 MED ORDER — PROPOFOL 10 MG/ML IV BOLUS
INTRAVENOUS | Status: DC | PRN
  Administered 2022-06-24: 150 mg via INTRAVENOUS

## 2022-06-24 MED ORDER — ACETAMINOPHEN 500 MG PO TABS: 1000.0000 mg | ORAL_TABLET | Freq: Once | ORAL | Status: DC

## 2022-06-24 MED ORDER — ORAL CARE MOUTH RINSE: 15.0000 mL | Freq: Once | OROMUCOSAL | Status: AC

## 2022-06-24 MED ORDER — EPHEDRINE SULFATE-NACL 50-0.9 MG/10ML-% IV SOSY
PREFILLED_SYRINGE | INTRAVENOUS | Status: DC | PRN
  Administered 2022-06-24 (×2): 5 mg via INTRAVENOUS

## 2022-06-24 SURGICAL SUPPLY — 43 items
APL PRP STRL LF DISP 70% ISPRP (MISCELLANEOUS) ×1
APL SKNCLS STERI-STRIP NONHPOA (GAUZE/BANDAGES/DRESSINGS) ×1
APPLIER CLIP 9.375 MED OPEN (MISCELLANEOUS)
APR CLP MED 9.3 20 MLT OPN (MISCELLANEOUS)
BAG COUNTER SPONGE SURGICOUNT (BAG) ×2 IMPLANT
BAG SPNG CNTER NS LX DISP (BAG) ×1
BENZOIN TINCTURE PRP APPL 2/3 (GAUZE/BANDAGES/DRESSINGS) ×2 IMPLANT
BINDER BREAST LRG (GAUZE/BANDAGES/DRESSINGS) IMPLANT
BINDER BREAST XLRG (GAUZE/BANDAGES/DRESSINGS) IMPLANT
CANISTER SUCT 3000ML PPV (MISCELLANEOUS) ×2 IMPLANT
CHLORAPREP W/TINT 26 (MISCELLANEOUS) ×2 IMPLANT
CLIP APPLIE 9.375 MED OPEN (MISCELLANEOUS) IMPLANT
COVER PROBE CYLINDRICAL 5X96 (MISCELLANEOUS) IMPLANT
COVER PROBE W GEL 5X96 (DRAPES) ×2 IMPLANT
COVER SURGICAL LIGHT HANDLE (MISCELLANEOUS) ×2 IMPLANT
DEVICE DUBIN SPECIMEN MAMMOGRA (MISCELLANEOUS) ×2 IMPLANT
DRAPE CHEST BREAST 15X10 FENES (DRAPES) ×2 IMPLANT
DRSG TEGADERM 4X4.75 (GAUZE/BANDAGES/DRESSINGS) ×2 IMPLANT
ELECT CAUTERY BLADE 6.4 (BLADE) ×2 IMPLANT
ELECT REM PT RETURN 9FT ADLT (ELECTROSURGICAL) ×1
ELECTRODE REM PT RTRN 9FT ADLT (ELECTROSURGICAL) ×2 IMPLANT
GAUZE SPONGE 2X2 8PLY STRL LF (GAUZE/BANDAGES/DRESSINGS) ×2 IMPLANT
GLOVE BIO SURGEON STRL SZ7 (GLOVE) ×2 IMPLANT
GLOVE BIOGEL PI IND STRL 7.5 (GLOVE) ×2 IMPLANT
GOWN STRL REUS W/ TWL LRG LVL3 (GOWN DISPOSABLE) ×4 IMPLANT
GOWN STRL REUS W/TWL LRG LVL3 (GOWN DISPOSABLE) ×2
ILLUMINATOR WAVEGUIDE N/F (MISCELLANEOUS) IMPLANT
KIT BASIN OR (CUSTOM PROCEDURE TRAY) ×2 IMPLANT
KIT MARKER MARGIN INK (KITS) ×2 IMPLANT
LIGHT WAVEGUIDE WIDE FLAT (MISCELLANEOUS) IMPLANT
NDL HYPO 25GX1X1/2 BEV (NEEDLE) ×2 IMPLANT
NEEDLE HYPO 25GX1X1/2 BEV (NEEDLE) ×1 IMPLANT
NS IRRIG 1000ML POUR BTL (IV SOLUTION) ×2 IMPLANT
PACK GENERAL/GYN (CUSTOM PROCEDURE TRAY) ×2 IMPLANT
SPONGE T-LAP 4X18 ~~LOC~~+RFID (SPONGE) ×2 IMPLANT
STRIP CLOSURE SKIN 1/2X4 (GAUZE/BANDAGES/DRESSINGS) ×2 IMPLANT
SUT MNCRL AB 4-0 PS2 18 (SUTURE) ×2 IMPLANT
SUT SILK 2 0 SH (SUTURE) IMPLANT
SUT VIC AB 3-0 SH 27 (SUTURE) ×1
SUT VIC AB 3-0 SH 27X BRD (SUTURE) ×2 IMPLANT
SYR CONTROL 10ML LL (SYRINGE) ×2 IMPLANT
TOWEL GREEN STERILE (TOWEL DISPOSABLE) ×2 IMPLANT
TOWEL GREEN STERILE FF (TOWEL DISPOSABLE) ×2 IMPLANT

## 2022-06-24 NOTE — Anesthesia Postprocedure Evaluation (Signed)
Anesthesia Post Note  Patient: Sierra Summers [facility]  Procedure(s) Performed: LEFT BREAST LUMPECTOMY WITH RADIOACTIVE SEED LOCALIZATION (Left: Breast)     Patient location during evaluation: PACU Anesthesia Type: General Level of consciousness: awake and alert, oriented and patient cooperative Pain management: pain level controlled Vital Signs Assessment: post-procedure vital signs reviewed and stable Respiratory status: spontaneous breathing, nonlabored ventilation and respiratory function stable Cardiovascular status: blood pressure returned to baseline and stable Postop Assessment: no apparent nausea or vomiting Anesthetic complications: no   No notable events documented.  Last Vitals:  Vitals:   06/24/22 1115 06/24/22 1130  BP: 138/80 (!) 143/80  Pulse: 99 93  Resp: 14 20  Temp:  36.6 C  SpO2: 96% 96%    Last Pain:  Vitals:   06/24/22 1130  TempSrc:   PainSc: 0-No pain                 Pervis Hocking

## 2022-06-24 NOTE — Discharge Instructions (Signed)
[facility] Office Phone Number [phone]  BREAST BIOPSY/ PARTIAL MASTECTOMY: POST OP INSTRUCTIONS  Always review your discharge instruction sheet given to you by the facility where your surgery was performed.  IF YOU HAVE DISABILITY OR FAMILY LEAVE FORMS, YOU MUST BRING THEM TO THE OFFICE FOR PROCESSING.  DO NOT GIVE THEM TO YOUR DOCTOR.  A prescription for pain medication may be given to you upon discharge.  Take your pain medication as prescribed, if needed.  If narcotic pain medicine is not needed, then you may take acetaminophen (Tylenol) or ibuprofen (Advil) as needed. Take your usually prescribed medications unless otherwise directed If you need a refill on your pain medication, please contact your pharmacy.  They will contact our office to request authorization.  Prescriptions will not be filled after 5pm or on week-ends. You should eat very light the first 24 hours after surgery, such as soup, crackers, pudding, etc.  Resume your normal diet the day after surgery. Most patients will experience some swelling and bruising in the breast.  Ice packs and a good support bra will help.  Swelling and bruising can take several days to resolve.  It is common to experience some constipation if taking pain medication after surgery.  Increasing fluid intake and taking a stool softener will usually help or prevent this problem from occurring.  A mild laxative (Milk of Magnesia or Miralax) should be taken according to package directions if there are no bowel movements after 48 hours. Unless discharge instructions indicate otherwise, you may remove your bandages 24-48 hours after surgery, and you may shower at that time.  You may have steri-strips (small skin tapes) in place directly over the incision.  These strips should be left on the skin for 7-10 days.  If your surgeon used skin glue on the incision, you may shower in 24 hours.  The glue will flake off over the next 2-3 weeks.  Any  sutures or staples will be removed at the office during your follow-up visit. ACTIVITIES:  You may resume regular daily activities (gradually increasing) beginning the next day.  Wearing a good support bra or sports bra minimizes pain and swelling.  You may have sexual intercourse when it is comfortable. You may drive when you no longer are taking prescription pain medication, you can comfortably wear a seatbelt, and you can safely maneuver your car and apply brakes. RETURN TO WORK:  ______________________________________________________________________________________ You should see your doctor in the office for a follow-up appointment approximately two weeks after your surgery.  Your doctor's nurse will typically make your follow-up appointment when she calls you with your pathology report.  Expect your pathology report 2-3 business days after your surgery.  You may call to check if you do not hear from us after three days. OTHER INSTRUCTIONS: _______________________________________________________________________________________________ _____________________________________________________________________________________________________________________________________ _____________________________________________________________________________________________________________________________________ _____________________________________________________________________________________________________________________________________  WHEN TO CALL YOUR DOCTOR: Fever over 101.0 Nausea and/or vomiting. Extreme swelling or bruising. Continued bleeding from incision. Increased pain, redness, or drainage from the incision.  The clinic staff is available to answer your questions during regular business hours.  Please don't hesitate to call and ask to speak to one of the nurses for clinical concerns.  If you have a medical emergency, go to the nearest emergency room or call 911.  A surgeon from [facility] is always on call at the hospital.  For further questions, please visit [email or web address]   

## 2022-06-24 NOTE — Interval H&P Note (Signed)
History and Physical Interval Note:  06/24/2022 9:16 AM  Sierra Summers  has presented today for surgery, with the diagnosis of LEFT BREAST DUCTAL CARCINOMA IN SITU.  The various methods of treatment have been discussed with the patient and family. After consideration of risks, benefits and other options for treatment, the patient has consented to  Procedure(s) with comments: LEFT BREAST LUMPECTOMY WITH RADIOACTIVE SEED LOCALIZATION (Left) - LMA as a surgical intervention.  The patient's history has been reviewed, patient examined, no change in status, stable for surgery.  I have reviewed the patient's chart and labs.  Questions were answered to the patient's satisfaction.     [name]

## 2022-06-24 NOTE — Anesthesia Procedure Notes (Signed)
Procedure Name: LMA Insertion Date/Time: 06/24/2022 10:06 AM  Performed by: Adalberto Ill, CRNAPre-anesthesia Checklist: Patient identified, Emergency Drugs available and Timeout performed Patient Re-evaluated:Patient Re-evaluated prior to induction Oxygen Delivery Method: Circle system utilized Preoxygenation: Pre-oxygenation with 100% oxygen Induction Type: IV induction Ventilation: Mask ventilation without difficulty LMA: LMA inserted LMA Size: 4.0 Number of attempts: 1 Placement Confirmation: positive ETCO2 and breath sounds checked- equal and bilateral ETT to lip (cm): yes. Tube secured with: Tape Dental Injury: Teeth and Oropharynx as per pre-operative assessment  Comments: Brief atraumatic dentition unchanged

## 2022-06-24 NOTE — Op Note (Signed)
Pre-op Diagnosis:  Left breast ductal carcinoma in situ Post-op Diagnosis: same Procedure:  Left radioactive seed localized lumpectomy Surgeon:  Anasia Agro K. 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  She has no family history of breast cancer.  She presents after recent screening mammogram showed a suspicious area of calcifications in the left lower outer quadrant.  The only previous breast problems included excision of a right breast cyst many years ago.  Further workup revealed a 6 mm area of calcifications in the lower outer quadrant.  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  ER/PR strongly positive.  After discussion with the patient, we recommended lumpectomy.  A radioactive seed was placed yesterday by radiology.  Description of procedure: The patient is brought to the operating room placed in supine position on the operating room table. After an adequate level of general anesthesia was obtained, her left breast was prepped with ChloraPrep and draped in sterile fashion. A timeout was taken to ensure the proper patient and proper procedure. We interrogated the breast with the neoprobe. We made a transverse incision in the lower outer quadrant of the breast after infiltrating with local anesthetic dissection was carried down in the breast tissue with cautery. We used the neoprobe to guide [place] towards the radioactive seed. We excised an area of tissue around the radioactive seed 2 cm in diameter.  The seed seems to be located in the posterior portion of the specimen. The specimen was removed and was oriented with a paint kit. Specimen mammogram showed the radioactive seed within the specimen.  However, the biopsy clip was not seen within the specimen.  I excised an additional 1 cm thickness of posterior margin which was also  oriented with paint kit.  Specimen mammogram also did not reveal the biopsy clip.  I cannot palpate a biopsy clip in the breast.  The 2 specimens were sent for pathologic examination. There is no residual radioactivity within the biopsy cavity. We inspected carefully for hemostasis. The wound was thoroughly irrigated.  We will await final pathology before determining any further excision.  The wound was closed with a deep layer of 3-0 Vicryl and a subcuticular layer of 4-0 Monocryl. Benzoin Steri-Strips were applied. The patient was then extubated and brought to the recovery room in stable condition. All sponge, instrument, and needle counts are correct.  Imogene Burn. Georgette Dover, MD, [facility]  General/ Trauma Surgery  06/24/2022 10:47 AM

## 2022-06-24 NOTE — Transfer of Care (Signed)
Immediate Anesthesia Transfer of Care Note  Patient: Sierra Summers [facility]  Procedure(s) Performed: LEFT BREAST LUMPECTOMY WITH RADIOACTIVE SEED LOCALIZATION (Left: Breast)  Patient Location: PACU  Anesthesia Type:General  Level of Consciousness: awake, alert , oriented, and patient cooperative  Airway & Oxygen Therapy: Patient Spontanous Breathing  Post-op Assessment: Report given to RN and Post -op Vital signs reviewed and stable  Post vital signs: Reviewed and stable  Last Vitals:  Vitals Value Taken Time  BP 130/74 06/24/22 1100  Temp    Pulse 106 06/24/22 1101  Resp 16 06/24/22 1101  SpO2 96 % 06/24/22 1101  Vitals shown include unvalidated device data.  Last Pain:  Vitals:   06/24/22 0816  TempSrc:   PainSc: 0-No pain      Patients Stated Pain Goal: 0 (33/29/51 8841)  Complications: No notable events documented.

## 2022-06-25 ENCOUNTER — Encounter (HOSPITAL_COMMUNITY): Payer: Self-pay | Admitting: Surgery

## 2022-07-01 ENCOUNTER — Encounter: Payer: Self-pay | Admitting: *Deleted

## 2022-07-01 LAB — SURGICAL PATHOLOGY

## 2022-07-12 NOTE — Assessment & Plan Note (Addendum)
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  Lymph nodes was not biopsied, which is reasonable given her very early stage and her advanced age  -Dr. Georgette Dover does not recommend more surgery  -she is not a candidate for radiation due to her previous radiation history  -I discussed benefit of adjuvant antiestrogen therapy including tamoxifen or AI

## 2022-07-13 ENCOUNTER — Encounter: Payer: Self-pay | Admitting: Hematology

## 2022-07-13 ENCOUNTER — Inpatient Hospital Stay: Payer: Medicare Other | Attending: Hematology | Admitting: Hematology

## 2022-07-13 VITALS — BP 150/58 | HR 41 | Temp 98.4°F | Resp 15 | Wt 157.5 lb

## 2022-07-13 DIAGNOSIS — C50512 Malignant neoplasm of lower-outer quadrant of left female breast: Secondary | ICD-10-CM | POA: Diagnosis not present

## 2022-07-13 DIAGNOSIS — Z170 Estrogen receptor positive status [ER+]: Secondary | ICD-10-CM | POA: Diagnosis not present

## 2022-07-13 DIAGNOSIS — Z79810 Long term (current) use of selective estrogen receptor modulators (SERMs): Secondary | ICD-10-CM | POA: Diagnosis not present

## 2022-07-13 DIAGNOSIS — N183 Chronic kidney disease, stage 3 unspecified: Secondary | ICD-10-CM | POA: Insufficient documentation

## 2022-07-13 DIAGNOSIS — Z8572 Personal history of non-Hodgkin lymphomas: Secondary | ICD-10-CM | POA: Diagnosis not present

## 2022-07-13 MED ORDER — TAMOXIFEN CITRATE 10 MG PO TABS: 10.0000 mg | ORAL_TABLET | Freq: Every day | ORAL | 2 refills | Status: DC

## 2022-07-13 NOTE — Progress Notes (Signed)
[place]   [phone] [phone]   Clinic Follow up Note   Patient Care Team: Virgie Dad, MD as PCP - General (Internal Medicine) Otelia Sergeant, OD as Referring Physician Jari Pigg, MD as Consulting Physician (Dermatology) Mauro Kaufmann, RN as Oncology Nurse Navigator Rockwell Germany, RN as Oncology Nurse Navigator Truitt Merle, MD as Consulting Physician (Hematology) Kyung Rudd, MD as Consulting Physician (Radiation Oncology) Donnie Mesa, MD as Consulting Physician (General Surgery)  Date of Service:  07/13/2022  CHIEF COMPLAINT: f/u of Breast Cancer, ER+   CURRENT THERAPY:  Tamoxifen '10mg'$  start August 04, 2022  ASSESSMENT:  Sierra Summers is a 77 y.o. female with   Malignant neoplasm of lower-outer quadrant of left breast of female, estrogen receptor positive ([place]) -pT1bN0M0 stage IA, G1, ER+/PR/HER2- -diagnosed in 05/2022 -Initial biopsy showed DCIS only -Reviewed her surgical pathology findings, which showed 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  Lymph nodes was not biopsied, which is reasonable given her very early stage and her advanced age  -Dr. Georgette Dover does not recommend more surgery  -she is not a candidate for radiation due to her previous radiation history  -I discussed benefit of adjuvant antiestrogen therapy including tamoxifen or AI  she is reluctant to take full dose tamoxifen at 20 mg daily, due to the concern of severe side effects.  After a lengthy discussion, she agrees to start at 10 mg daily for first 3 months, if she tolerates well, she may be willing to try 20 mg daily.  I explained to her that the low-dose tamoxifen is only for DCIS patient, due to her invasive cancer, I recommend full dose for 5 years. -I also discussed the option of aromatase inhibitor, such as anastrozole.  Benefit and side effects explained to her.  Good understanding.     PLAN: - Discuss  the surgical pathology report -No additional Surgery -recommend Antiestrogen therapy Tamoxifen for 5 years  - Tamoxifen 10 mg for 3 months if tolerate well , will increase to 20 mg - survivorship NP-Lacie in 3 months.lab,f/u in 6 months  SUMMARY OF ONCOLOGIC HISTORY: Oncology History  Malignant neoplasm of lower-outer quadrant of left breast of female, estrogen receptor positive ([place])  05/30/2022 Cancer Staging   Staging form: Breast, AJCC 8th Edition - Clinical stage from 05/30/2022: Stage 0 (cTis (DCIS), cN0, cM0, G2, ER+, PR+, HER2: Not Assessed) - Signed by Truitt Merle, MD on 06/08/2022 Stage prefix: Initial diagnosis Histologic grading system: 3 grade system   06/03/2022 Initial Diagnosis   Ductal carcinoma in situ (DCIS) of left breast   06/24/2022 Cancer Staging   Staging form: Breast, AJCC 8th Edition - Pathologic stage from 06/24/2022: Stage Unknown (pT1b, pNX, cM0, G1, ER+, PR+, HER2-) - Signed by Truitt Merle, MD on 07/12/2022 Stage prefix: Initial diagnosis Nuclear grade: G1 Histologic grading system: 3 grade system Residual tumor (R): R1 - Microscopic      INTERVAL HISTORY:  Sierra Summers is here for a follow up of Breast Cancer, ER+ She was last seen by me on 06/08/22 She presents to the clinic alone. Pt reports that she is doing well.      All other systems were reviewed with the patient and are negative.  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   Osteopenia    Phlebitis    Sciatica of left side     SURGICAL HISTORY: Past Surgical History:  Procedure Laterality Date   BONE MARROW  TRANSPLANT  1991   BREAST BIOPSY Left 05/30/2022   MM LT BREAST BX W LOC DEV 1ST LESION IMAGE BX SPEC STEREO GUIDE 05/30/2022 [facility]   BREAST BIOPSY  06/23/2022   MM LT RADIOACTIVE SEED LOC MAMMO GUIDE 06/23/2022 [facility]   BREAST CYST EXCISION Right    BREAST LUMPECTOMY WITH RADIOACTIVE SEED LOCALIZATION Left 06/24/2022   Procedure: LEFT BREAST LUMPECTOMY WITH RADIOACTIVE SEED LOCALIZATION;  Surgeon: Donnie Mesa, MD;  Location: [facility];  Service: General;  Laterality: Left;  [place] and 1992    I have reviewed the social history and family history with the patient and they are unchanged from previous note.  ALLERGIES:  is allergic to sulfa antibiotics.  MEDICATIONS:  Current Outpatient Medications  Medication Sig Dispense Refill   tamoxifen (NOLVADEX) 10 MG tablet Take 1 tablet (10 mg total) by mouth daily. 30 tablet 2   aspirin EC 81 MG tablet Take 81 mg by mouth daily.     Black Pepper-Turmeric (TURMERIC CURCUMIN) 11-998 MG CAPS Take 1 capsule by mouth daily.     Coenzyme Q10 (CO Q 10) 100 MG CAPS Take 100 mg by mouth daily.     losartan (COZAAR) 50 MG tablet Take 1 tablet (50 mg total) by mouth daily. 90 tablet 3   LUTEIN PO Take 1 tablet by mouth daily.     Multiple Vitamin (MULTIVITAMIN) tablet Take 1 tablet by mouth daily.     Omega-3 Fatty Acids (OMEGA 3 PO) Take 1,280 mg by mouth daily.     OVER THE COUNTER MEDICATION Take 1 capsule by mouth daily. Cordyceps     OVER THE COUNTER MEDICATION Take 750 mg by mouth daily. Bacopa     simvastatin (ZOCOR) 40 MG tablet Take 0.5 tablets (20 mg total) by mouth daily at 6 PM. 45 tablet 1   traZODone (DESYREL) 50 MG tablet Take 1 tablet (50 mg total) by mouth at bedtime. (Patient not taking: Reported on 06/15/2022) 90 tablet 3   No current facility-administered medications for this visit.    PHYSICAL EXAMINATION: ECOG PERFORMANCE STATUS: 0 - Asymptomatic  Vitals:   07/13/22 1343  BP: (!) 150/58   Pulse: (!) 41  Resp: 15  Temp: 98.4 F (36.9 C)  SpO2: 97%   Wt Readings from Last 3 Encounters:  07/13/22 157 lb 8 oz (71.4 kg)  06/24/22 154 lb (69.9 kg)  06/17/22 156 lb (70.8 kg)    GENERAL:alert, no distress and comfortable SKIN: skin color, texture, turgor are normal, no rashes or significant lesions EYES: normal, Conjunctiva are pink and non-injected, sclera clear LYMPH:(-)   no palpable lymphadenopathy in the cervical, axillary  LUNGS: clear to auscultation and percussion with normal breathing effort HEART: regular rate & rhythm and no murmurs and no lower extremity edema Musculoskeletal:no cyanosis of digits and no clubbing  NEURO: alert & oriented x 3 with fluent speech, no focal motor/sensory deficits BREAST: Post left lumpectomy, (+) soft tissue fullness and tenderness at the incision,  has healed well.  No palpable mass in right breast or axilla     LABORATORY DATA:  I have reviewed the data as listed    Latest Ref Rng & Units 06/08/2022   12:08 PM 11/16/2021   12:00 AM 11/03/2020    3:00 AM  CBC  WBC 4.0 - 10.5 K/uL 5.9  4.8     5.6      Hemoglobin 12.0 - 15.0 g/dL 13.4  13.5     13.4      Hematocrit 36.0 - 46.0 % 38.5  39     39      Platelets 150 - 400 K/uL 235  286     284         This result is from an external source.        Latest Ref Rng & Units 06/08/2022   12:08 PM 05/17/2022   12:00 AM 11/16/2021   12:00 AM  CMP  Glucose 70 - 99 mg/dL 108     BUN 8 - 23 mg/dL '22  23     25      '$ Creatinine 0.44 - 1.00 mg/dL 1.31  1.1     1.2      Sodium 135 - 145 mmol/L 140  141     144      Potassium 3.5 - 5.1 mmol/L 4.4  4.6     5.0      Chloride 98 - 111 mmol/L 108  106     109      CO2 22 - 32 mmol/L '25  22     20      '$ Calcium 8.9 - 10.3 mg/dL 9.7  9.6     9.6      Total Protein 6.5 - 8.1 g/dL 7.2     Total Bilirubin 0.3 - 1.2 mg/dL 0.7     Alkaline Phos 38 - 126 U/L 56   59      AST 15 - 41 U/L 29   24      ALT 0 - 44 U/L 23   19         This result  is from an external source.      RADIOGRAPHIC STUDIES: I have personally reviewed the radiological images as listed and agreed with the findings in the report. No results found.    No orders of the defined types were placed in this encounter.  All questions were answered. The patient knows to call the clinic with any problems, questions or concerns. No barriers to learning was detected. The total time spent in the appointment was 40 minutes.     Truitt Merle, MD 07/13/2022   Felicity Coyer, CMA, am acting as scribe for Truitt Merle, MD.   I have reviewed the above documentation for accuracy and completeness, and I agree with the above.

## 2022-07-15 ENCOUNTER — Encounter: Payer: Self-pay | Admitting: *Deleted

## 2022-07-15 DIAGNOSIS — D0512 Intraductal carcinoma in situ of left breast: Secondary | ICD-10-CM

## 2022-07-15 DIAGNOSIS — C50512 Malignant neoplasm of lower-outer quadrant of left female breast: Secondary | ICD-10-CM

## 2022-07-18 ENCOUNTER — Other Ambulatory Visit: Payer: Self-pay

## 2022-07-18 DIAGNOSIS — I10 Essential (primary) hypertension: Secondary | ICD-10-CM

## 2022-07-18 MED ORDER — LOSARTAN POTASSIUM 50 MG PO TABS: 50.0000 mg | ORAL_TABLET | Freq: Every day | ORAL | 3 refills | Status: DC

## 2022-07-19 ENCOUNTER — Telehealth: Payer: Self-pay | Admitting: Radiation Oncology

## 2022-07-19 NOTE — Telephone Encounter (Signed)
I called the patient to follow up after she had met with Dr. Georgette Dover and Dr. Burr Medico. Dr. Lisbeth Renshaw has also reviewed her pathology and would not recommend radiation after further understanding of her margins as per Dr. Vonna Kotyk most recent office visit note. We would forgo this especially given her prior history of mantle field radiation for lymphoma. She is in agreement with this plan and will follow up with Dr. Georgette Dover and Dr. Burr Medico in surveillance, but we would be happy to see her back as needed in the future if there were needs for discussion.

## 2022-08-23 ENCOUNTER — Encounter (HOSPITAL_COMMUNITY): Payer: Self-pay

## 2022-10-12 ENCOUNTER — Inpatient Hospital Stay: Payer: Medicare Other | Attending: Hematology | Admitting: Nurse Practitioner

## 2022-10-12 ENCOUNTER — Encounter: Payer: Self-pay | Admitting: Nurse Practitioner

## 2022-10-12 VITALS — BP 147/63 | HR 78 | Temp 98.2°F | Resp 18 | Ht 66.0 in | Wt 157.4 lb

## 2022-10-12 DIAGNOSIS — Z79810 Long term (current) use of selective estrogen receptor modulators (SERMs): Secondary | ICD-10-CM | POA: Insufficient documentation

## 2022-10-12 DIAGNOSIS — C50512 Malignant neoplasm of lower-outer quadrant of left female breast: Secondary | ICD-10-CM | POA: Insufficient documentation

## 2022-10-12 DIAGNOSIS — Z170 Estrogen receptor positive status [ER+]: Secondary | ICD-10-CM | POA: Insufficient documentation

## 2022-10-12 NOTE — Progress Notes (Signed)
CLINIC:  Survivorship   Patient Care Team: Mahlon Gammon, MD as PCP - General (Internal Medicine) Jill Side, OD as Referring Physician Elmon Else, MD as Consulting Physician (Dermatology) Pershing Proud, RN as Oncology Nurse Navigator Donnelly Angelica, RN as Oncology Nurse Navigator Malachy Mood, MD as Consulting Physician (Hematology) Dorothy Puffer, MD as Consulting Physician (Radiation Oncology) Manus Rudd, MD as Consulting Physician (General Surgery) Pollyann Samples, NP as Nurse Practitioner (Nurse Practitioner)   REASON FOR VISIT:  Routine follow-up post-treatment for a recent history of breast cancer.  BRIEF ONCOLOGIC HISTORY:  Oncology History  Malignant neoplasm of lower-outer quadrant of left breast of female, estrogen receptor positive  05/30/2022 Cancer Staging   Staging form: Breast, AJCC 8th Edition - Clinical stage from 05/30/2022: Stage 0 (cTis (DCIS), cN0, cM0, G2, ER+, PR+, HER2: Not Assessed) - Signed by Malachy Mood, MD on 06/08/2022 Stage prefix: Initial diagnosis Histologic grading system: 3 grade system   06/03/2022 Initial Diagnosis   Ductal carcinoma in situ (DCIS) of left breast   06/24/2022 Cancer Staging   Staging form: Breast, AJCC 8th Edition - Pathologic stage from 06/24/2022: Stage Unknown (pT1b, pNX, cM0, G1, ER+, PR+, HER2-) - Signed by Malachy Mood, MD on 07/12/2022 Stage prefix: Initial diagnosis Nuclear grade: G1 Histologic grading system: 3 grade system Residual tumor (R): R1 - Microscopic   06/24/2022 Definitive Surgery   Procedure:  Left radioactive seed localized lumpectomy Surgeon:  Manus Rudd K.   08/04/2022 -  Anti-estrogen oral therapy   Adjuvant Tamoxifen    10/12/2022 Survivorship   SCP given by Santiago Glad, NP     INTERVAL HISTORY:  Sierra Summers presents to the Survivorship Clinic today for our initial meeting to review her survivorship care plan detailing her treatment course for breast cancer, as well as  monitoring long-term side effects of that treatment, education regarding health maintenance, screening, and overall wellness and health promotion.     She was taking tamoxifen 10 mg and tolerating well until she increased to 20 on 09/19/2022.  After about 7 days she developed joint aches, foot pain, mild "stomach issues" not n/v/c/d more an unsettled feeling, and mild urinary incontinence like almost not making it to the bathroom.  The most concerning side effect is what she describes as "mental apathy". She does not want to go through life like that.  She has a trip to [place] coming up and she is not even excited about it she attributes this to tamoxifen.  Not depressed or anxious.  Denies other concerns.    REVIEW OF SYSTEMS:  Review of Systems - Oncology Breast: Denies any new nodularity, masses, tenderness, nipple changes, or nipple discharge.      ONCOLOGY TREATMENT TEAM:  1. Surgeon:  Dr. Corliss Skains at [facility] 2. Medical Oncologist: Dr. Mosetta Putt  3. Radiation Oncologist: Dr. Mitzi Hansen (did not receive radiation)    PAST MEDICAL/SURGICAL HISTORY:  Past Medical History:  Diagnosis Date   Asthma    exercised induced   Breast cyst    Cataract    Chronic kidney disease, stage 3    Complication of anesthesia    agitation   Heart murmur    Hx of phlebitis 09/28/2016   Hx of skin cancer, basal cell 09/28/2016   Hx of squamous cell carcinoma 09/28/2016   Hyperlipidemia    Hypertension    Non Hodgkin's lymphoma 1989   Osteopenia    Phlebitis    Sciatica of left side  Past Surgical History:  Procedure Laterality Date   BONE MARROW TRANSPLANT  1991   BREAST BIOPSY Left 05/30/2022   MM LT BREAST BX W LOC DEV 1ST LESION IMAGE BX SPEC STEREO GUIDE 05/30/2022 [facility]   BREAST BIOPSY  06/23/2022   MM LT RADIOACTIVE SEED LOC MAMMO GUIDE 06/23/2022 [facility]   BREAST CYST EXCISION Right    BREAST LUMPECTOMY WITH RADIOACTIVE SEED LOCALIZATION Left 06/24/2022    Procedure: LEFT BREAST LUMPECTOMY WITH RADIOACTIVE SEED LOCALIZATION;  Surgeon: Manus Rudd, MD;  Location: [facility];  Service: General;  Laterality: Left;  LMA   Laprascopy  1989 and 1992     ALLERGIES:  Allergies  Allergen Reactions   Sulfa Antibiotics Rash     CURRENT MEDICATIONS:  Outpatient Encounter Medications as of 10/12/2022  Medication Sig Note   aspirin EC 81 MG tablet Take 81 mg by mouth daily. 06/15/2022: Holding for procedure   Black Pepper-Turmeric (TURMERIC CURCUMIN) 11-998 MG CAPS Take 1 capsule by mouth daily.    Coenzyme Q10 (CO Q 10) 100 MG CAPS Take 100 mg by mouth daily.    losartan (COZAAR) 50 MG tablet Take 1 tablet (50 mg total) by mouth daily.    LUTEIN PO Take 1 tablet by mouth daily.    Multiple Vitamin (MULTIVITAMIN) tablet Take 1 tablet by mouth daily.    Omega-3 Fatty Acids (OMEGA 3 PO) Take 1,280 mg by mouth daily.    OVER THE COUNTER MEDICATION Take 1 capsule by mouth daily. Cordyceps    OVER THE COUNTER MEDICATION Take 750 mg by mouth daily. Bacopa    simvastatin (ZOCOR) 40 MG tablet Take 0.5 tablets (20 mg total) by mouth daily at 6 PM.    tamoxifen (NOLVADEX) 10 MG tablet Take 1 tablet (10 mg total) by mouth daily.    traZODone (DESYREL) 50 MG tablet Take 1 tablet (50 mg total) by mouth at bedtime.    No facility-administered encounter medications on file as of 10/12/2022.     ONCOLOGIC FAMILY HISTORY:  Family History  Problem Relation Age of Onset   Heart disease Mother    Hyperlipidemia Mother    Hypertension Mother    Stroke Mother    Diabetes Father    Heart disease Father    Hyperlipidemia Father    Mental illness Father    Alcohol abuse Father    Hyperlipidemia Sister    Hypertension Sister    Cancer Maternal Grandmother    Heart disease Maternal Grandfather    Mental illness Paternal Grandmother    Mental illness Paternal Grandfather    Heart attack Sister    Stroke Sister    Dementia Sister    High blood pressure Sister     High Cholesterol Sister    Parkinson's disease Sister      GENETIC COUNSELING/TESTING: No  SOCIAL HISTORY:  Sierra Summers is divorced.  Ms. Tolle is not currently working.  She is very active.  She quit smoking in the 1980s, limits alcohol, and denies illicit drug use.     PHYSICAL EXAMINATION:  Vital Signs:   Vitals:   10/12/22 1127  BP: (!) 147/63  Pulse: 78  Resp: 18  Temp: 98.2 F (36.8 C)  SpO2: 97%   Filed Weights   10/12/22 1127  Weight: 157 lb 6.4 oz (71.4 kg)   General: Well-nourished, well-appearing female in no acute distress.   HEENT:  Sclerae anicteric.  Respiratory:  breathing non-labored.  Neuro: No focal deficits. Steady gait.  Psych: Mood and affect normal and appropriate for situation.  Extremities: No edema. Skin: Warm and dry. Breast exam: Limited exam reveals a well-healed surgical scar in the left breast lower outer quadrant, no palpable mass or nodularity that I could appreciate  LABORATORY DATA:  None for this visit.  DIAGNOSTIC IMAGING:  None for this visit.      ASSESSMENT AND PLAN:  Ms.. Grice is a pleasant 77 y.o. 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  She presents to the Survivorship Clinic for our initial meeting and routine follow-up post-completion of treatment for breast cancer.    1. Stage I left breast cancer:  Ms. Suggs has recovered well from definitive surgery for breast cancer. She will follow-up with her medical oncologist, Dr. Mosetta Putt in 01/2023 with history and physical exam per surveillance protocol.  She not tolerate full dose tamoxifen, with joint pain, stomach issues, urinary incontinence, but primarily intolerable side effect was "mental apathy".  She will continue Tamoxifen 10 mg for now, although we discussed this dose is for DCIS not invasive cancer.  We will update DEXA before next visit and discuss possibly switching to AI.  Though the incidence is low, there is an associated risk of endometrial cancer with anti-estrogen therapies like Tamoxifen.  Ms. Tillery was encouraged to contact Dr. Mosetta Putt or myself with any vaginal bleeding while taking Tamoxifen. Other side effects of Tamoxifen were again reviewed with her as well. We discussed thrombosis risk reducing behaviors with tamoxifen as she prepares for international travel. Today, a comprehensive survivorship care plan and treatment summary was reviewed with the patient today detailing her breast cancer diagnosis, treatment course, potential late/long-term effects of treatment, appropriate follow-up care with recommendations for the future, and patient education resources.  A copy of this summary, along with a letter will be sent to the patient's primary care provider via mail/fax/In Basket message after today's visit.    2. Bone health:  Given Ms. Wofford age/history of breast cancer and Osteopenia, Tamoxifen was recommended as anti-estrogen therapy with. Last DEXA was 2021, showed T score -1.6. We will update her DEXA before next visit in July. In the meantime, take daily calcium and vitamin D, as well as increase her weight-bearing activities.  She was given education on specific activities to promote bone health.  3. Cancer screening:  Due to Ms. Sisler history and her age, she should receive screening for skin cancers and endometrial cancers. She did not meet parameters for lung cancer screening, and has declined further colonoscopies.  The information and recommendations are listed on the patient's comprehensive care plan/treatment summary and were reviewed in detail with the patient.    4. Health maintenance and wellness promotion: Ms. Macchione was encouraged to consume 5-7 servings of fruits and vegetables per day. She was encouraged to engage in moderate to vigorous exercise for  30 minutes per day most days of the week. She was instructed to limit her alcohol consumption and continue to abstain from tobacco use.     5. Support services/counseling: It is not uncommon for this period of the patient's cancer care trajectory to be one of many emotions and stressors.  She appears to be coping well.  She was given information regarding our available services and encouraged to contact me with any questions or for help enrolling in any of our support group/programs.    Dispo:   -Continue Tamoxifen 10  mg po daily -DEXA before next visit -Return to cancer center 01/11/23 as scheduled  -Mammogram due in 04/2023 -Follow up with surgery as scheduled  -She is welcome to return back to the Survivorship Clinic at any time; no additional follow-up needed at this time.  -Consider referral back to survivorship as a long-term survivor for continued surveillance  Orders Placed This Encounter  Procedures   MM DIAG BREAST TOMO BILATERAL    Standing Status:   Future    Standing Expiration Date:   10/12/2023    Order Specific Question:   Reason for Exam (SYMPTOM  OR DIAGNOSIS REQUIRED)    Answer:   left breast cancer dx 05/2022 s/p lumpectomy    Order Specific Question:   Preferred imaging location?    Answer[facility]   DG Bone Density    Standing Status:   Future    Standing Expiration Date:   10/12/2023    Order Specific Question:   Reason for Exam (SYMPTOM  OR DIAGNOSIS REQUIRED)    Answer:   osteopenia, on anti-estrogen therapy    Order Specific Question:   Preferred imaging location?    Answer:   [facility]      A total of (30) minutes of face-to-face time was spent with this patient with greater than 50% of that time in counseling and care-coordination.   Santiago GladLacie Rhylen Shaheen, NP Survivorship Program [facility] [phone]   Note: PRIMARY CARE PROVIDER Mahlon GammonGupta, Anjali L, MD [phone]

## 2022-10-15 ENCOUNTER — Other Ambulatory Visit: Payer: Self-pay | Admitting: Hematology

## 2022-10-15 ENCOUNTER — Other Ambulatory Visit: Payer: Self-pay | Admitting: Internal Medicine

## 2022-10-15 DIAGNOSIS — E785 Hyperlipidemia, unspecified: Secondary | ICD-10-CM

## 2022-10-17 ENCOUNTER — Other Ambulatory Visit: Payer: Self-pay

## 2022-11-21 ENCOUNTER — Encounter: Payer: Medicare Other | Admitting: Adult Health

## 2022-11-22 NOTE — Progress Notes (Signed)
This encounter was created in error - please disregard.

## 2022-12-07 ENCOUNTER — Other Ambulatory Visit: Payer: Self-pay | Admitting: Hematology

## 2022-12-20 LAB — BASIC METABOLIC PANEL
BUN: 23 — AB (ref 4–21)
CO2: 20 (ref 13–22)
Chloride: 107 (ref 99–108)
Creatinine: 1.4 — AB (ref 0.5–1.1)
Glucose: 118
Potassium: 4.6 mEq/L (ref 3.5–5.1)
Sodium: 141 (ref 137–147)

## 2022-12-20 LAB — CBC AND DIFFERENTIAL
HCT: 44 (ref 36–46)
Hemoglobin: 14.3 (ref 12.0–16.0)
Platelets: 271 10*3/uL (ref 150–400)
WBC: 4.9

## 2022-12-20 LAB — HEPATIC FUNCTION PANEL
ALT: 24 U/L (ref 7–35)
AST: 31 (ref 13–35)
Alkaline Phosphatase: 47 (ref 25–125)
Bilirubin, Total: 0.4

## 2022-12-20 LAB — COMPREHENSIVE METABOLIC PANEL
Albumin: 4.3 (ref 3.5–5.0)
Calcium: 9.2 (ref 8.7–10.7)
Globulin: 2.2
eGFR: 38

## 2022-12-20 LAB — LIPID PANEL
Cholesterol: 142 (ref 0–200)
HDL: 60 (ref 35–70)
LDL Cholesterol: 67
LDl/HDL Ratio: 2.4
Triglycerides: 75 (ref 40–160)

## 2022-12-20 LAB — TSH: TSH: 4.63 (ref 0.41–5.90)

## 2022-12-20 LAB — CBC: RBC: 4.31 (ref 3.87–5.11)

## 2022-12-25 NOTE — Progress Notes (Unsigned)
Location:  [facility]  POS: Clinic  Provider: Fletcher Anon, ANP  Code Status: DNR Goals of Care:     12/26/2022   12:57 PM  Advanced Directives  Does Patient Have a Medical Advance Directive? Yes  Type of Estate agent of [place];Living will;Out of facility DNR (pink MOST or yellow form)  Does patient want to make changes to medical advance directive? No - Patient declined     Chief Complaint  Patient presents with   Medical Management of Chronic Issues    Patient is being seen for 6 month follow up.   Immunizations    Discuss the need for Covid vaccine  tdap vaccine   Quality Metric Gaps    Discuss the need for AWV patient declined    HPI: Patient is a 77 y.o. female seen today for medical management of chronic diseases.    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 Now on 10 mg. No chemo or radiation.   Still walking and play pickleball. Has no acute complaints  No longer using trazodone  BP controlled  On zocor Lab Results  Component Value Date   LDLCALC 67 12/20/2022   CKD  Lab Results  Component Value Date   BUN 23 (A) 12/20/2022   Lab Results  Component Value Date   CREATININE 1.4 (A) 12/20/2022     Colonoscopy: declined further colonoscopies, aged out  Mammogram: due in Oct  Bone density screening: DEXA was 2021, showed T score -1.6.  Next BMD ordered in Oct  TSH 4.6  Past Medical History:  Diagnosis Date   Asthma    exercised induced   Breast cyst    Cataract    Chronic kidney disease, stage 3 (HCC)    Complication of anesthesia    agitation   Heart murmur    Hx of phlebitis  09/28/2016   Hx of skin cancer, basal cell 09/28/2016   Hx of squamous cell carcinoma 09/28/2016   Hyperlipidemia    Hypertension    Non Hodgkin's lymphoma (HCC) 1989   Osteopenia    Phlebitis    Sciatica of left side     Past Surgical History:  Procedure Laterality Date   BONE MARROW TRANSPLANT  1991   BREAST BIOPSY Left 05/30/2022   MM LT BREAST BX W LOC DEV 1ST LESION IMAGE BX SPEC STEREO GUIDE 05/30/2022 [facility]   BREAST BIOPSY  06/23/2022   MM LT RADIOACTIVE SEED LOC MAMMO GUIDE 06/23/2022 [facility]   BREAST CYST EXCISION Right    BREAST LUMPECTOMY WITH RADIOACTIVE SEED LOCALIZATION Left 06/24/2022   Procedure: LEFT BREAST LUMPECTOMY WITH RADIOACTIVE SEED LOCALIZATION;  Surgeon: Manus Rudd, MD;  Location: [facility] OR;  Service: General;  Laterality: Left;  LMA   Laprascopy  1989 and 1992    Allergies  Allergen Reactions   Sulfa Antibiotics Rash    Outpatient Encounter Medications as of 12/26/2022  Medication Sig   aspirin EC 81 MG tablet Take 81 mg by mouth daily.   Black Pepper-Turmeric (TURMERIC CURCUMIN) 11-998 MG CAPS Take 1 capsule by mouth daily.   Coenzyme Q10 (CO Q 10) 100 MG CAPS Take 100 mg by mouth daily.   losartan (  COZAAR) 50 MG tablet Take 1 tablet (50 mg total) by mouth daily.   LUTEIN PO Take 1 tablet by mouth daily.   Multiple Vitamin (MULTIVITAMIN) tablet Take 1 tablet by mouth daily.   Omega-3 Fatty Acids (OMEGA 3 PO) Take 1,280 mg by mouth daily.   OVER THE COUNTER MEDICATION Take 1 capsule by mouth daily. Cordyceps   OVER THE COUNTER MEDICATION Take 750 mg by mouth daily. Bacopa   simvastatin (ZOCOR) 40 MG tablet TAKE 0.5 TABLETS (20 MG TOTAL) BY MOUTH DAILY AT 6 PM.   tamoxifen (NOLVADEX) 10 MG tablet TAKE 1 TABLET BY MOUTH EVERY DAY   traZODone (DESYREL) 50 MG tablet Take 1 tablet (50 mg total) by mouth at bedtime.   No facility-administered encounter medications on file as of 12/26/2022.    Review of Systems:  Review of  Systems  Constitutional:  Negative for activity change, appetite change, chills, diaphoresis, fatigue, fever and unexpected weight change.  HENT:  Negative for congestion.   Respiratory:  Negative for cough, shortness of breath and wheezing.   Cardiovascular:  Negative for chest pain, palpitations and leg swelling.  Gastrointestinal:  Negative for abdominal distention, abdominal pain, constipation and diarrhea.  Genitourinary:  Negative for difficulty urinating and dysuria.  Musculoskeletal:  Negative for arthralgias, back pain, gait problem, joint swelling and myalgias.  Neurological:  Negative for dizziness, tremors, seizures, syncope, facial asymmetry, speech difficulty, weakness, light-headedness, numbness and headaches.  Psychiatric/Behavioral:  Negative for agitation, behavioral problems and confusion.     Health Maintenance  Topic Date Due   DTaP/Tdap/Td (2 - Td or Tdap) 05/01/2017   Medicare Annual Wellness (AWV)  09/17/2021   COVID-19 Vaccine (5 - 2023-24 season) 03/04/2022   INFLUENZA VACCINE  02/02/2023   Pneumonia Vaccine 52+ Years old  Completed   DEXA SCAN  Completed   Hepatitis C Screening  Completed   Zoster Vaccines- Shingrix  Completed   HPV VACCINES  Aged Out   Fecal DNA (Cologuard)  Discontinued    Physical Exam: Vitals:   12/26/22 1254  BP: 132/72  Pulse: 94  Resp: 17  Temp: 97.6 F (36.4 C)  TempSrc: Temporal  SpO2: 96%  Weight: 158 lb 6.4 oz (71.8 kg)  Height: 5\' 6"  (1.676 m)   Body mass index is 25.57 kg/m. Physical Exam Vitals and nursing note reviewed.  Constitutional:      General: She is not in acute distress.    Appearance: She is not diaphoretic.  HENT:     Head: Normocephalic and atraumatic.     Right Ear: Tympanic membrane normal.     Left Ear: Tympanic membrane normal.     Ears:     Comments: Small amt of dark cerumen noted in both ears. Irregular dark border to both TM    Nose: Nose normal.     Mouth/Throat:     Mouth: Mucous  membranes are moist.     Pharynx: Oropharynx is clear.  Eyes:     Conjunctiva/sclera: Conjunctivae normal.     Pupils: Pupils are equal, round, and reactive to light.  Neck:     Vascular: No JVD.  Cardiovascular:     Rate and Rhythm: Normal rate and regular rhythm.     Heart sounds: No murmur heard.    Comments: Reg with skipped beat Pulmonary:     Effort: Pulmonary effort is normal. No respiratory distress.     Breath sounds: Normal breath sounds. No wheezing.  Abdominal:     General: Bowel  sounds are normal. There is no distension.     Palpations: Abdomen is soft. There is no mass.     Tenderness: There is no abdominal tenderness.  Musculoskeletal:     Cervical back: No rigidity or tenderness.     Right lower leg: No edema.     Left lower leg: No edema.  Lymphadenopathy:     Cervical: No cervical adenopathy.  Skin:    General: Skin is warm and dry.  Neurological:     Mental Status: She is alert and oriented to person, place, and time.  Psychiatric:        Mood and Affect: Mood normal.     Labs reviewed: Basic Metabolic Panel: Recent Labs    05/17/22 0000 06/08/22 1208 12/20/22 0000  NA 141 140 141  K 4.6 4.4 4.6  CL 106 108 107  CO2 22 25 20   GLUCOSE  --  108*  --   BUN 23* 22 23*  CREATININE 1.1 1.31* 1.4*  CALCIUM 9.6 9.7 9.2  TSH  --   --  4.63   Liver Function Tests: Recent Labs    06/08/22 1208 12/20/22 0000  AST 29 31  ALT 23 24  ALKPHOS 56 47  BILITOT 0.7  --   PROT 7.2  --   ALBUMIN 4.4 4.3   No results for input(s): "LIPASE", "AMYLASE" in the last 8760 hours. No results for input(s): "AMMONIA" in the last 8760 hours. CBC: Recent Labs    06/08/22 1208 12/20/22 0000  WBC 5.9 4.9  NEUTROABS 3.9  --   HGB 13.4 14.3  HCT 38.5 44  MCV 96.3  --   PLT 235 271   Lipid Panel: Recent Labs    12/20/22 0000  CHOL 142  HDL 60  LDLCALC 67  TRIG 75   No results found for: "HGBA1C"  Procedures since last visit: No results  found.  Assessment/Plan  1. Essential hypertension Controlled - losartan (COZAAR) 50 MG tablet; Take 1 tablet (50 mg total) by mouth daily.  Dispense: 90 tablet; Refill: 3  2. Hyperlipidemia, unspecified hyperlipidemia type  - simvastatin (ZOCOR) 40 MG tablet; Take 0.5 tablets (20 mg total) by mouth daily at 6 PM.  Dispense: 45 tablet; Refill: 1  3. Psychophysiological insomnia She is not currently using this med Making it prn  4. Subclinical hypothyroidism Lab Results  Component Value Date   TSH 4.63 12/20/2022    Monitor for symptoms, will continue to follow  5. Stage 3a chronic kidney disease (HCC) Continue to periodically monitor BMP and avoid nephrotoxic agents Hydrate   Labs/tests ordered:  * No order type specified * TSH CMP CBC Next appt:  6 months with Dr Reece Agar   Total time :  time greater than 50% of total time spent doing pt counseling and coordination of care

## 2022-12-26 ENCOUNTER — Other Ambulatory Visit: Payer: Self-pay

## 2022-12-26 ENCOUNTER — Non-Acute Institutional Stay: Payer: Medicare Other | Admitting: Adult Health

## 2022-12-26 ENCOUNTER — Encounter: Payer: Self-pay | Admitting: Adult Health

## 2022-12-26 VITALS — BP 132/72 | HR 94 | Temp 97.6°F | Resp 17 | Ht 66.0 in | Wt 158.4 lb

## 2022-12-26 DIAGNOSIS — C50512 Malignant neoplasm of lower-outer quadrant of left female breast: Secondary | ICD-10-CM

## 2022-12-26 DIAGNOSIS — E038 Other specified hypothyroidism: Secondary | ICD-10-CM

## 2022-12-26 DIAGNOSIS — F5104 Psychophysiologic insomnia: Secondary | ICD-10-CM

## 2022-12-26 DIAGNOSIS — Z170 Estrogen receptor positive status [ER+]: Secondary | ICD-10-CM

## 2022-12-26 DIAGNOSIS — N1832 Chronic kidney disease, stage 3b: Secondary | ICD-10-CM

## 2022-12-26 DIAGNOSIS — I10 Essential (primary) hypertension: Secondary | ICD-10-CM | POA: Diagnosis not present

## 2022-12-26 DIAGNOSIS — I491 Atrial premature depolarization: Secondary | ICD-10-CM

## 2022-12-26 DIAGNOSIS — E7800 Pure hypercholesterolemia, unspecified: Secondary | ICD-10-CM

## 2022-12-26 MED ORDER — TRAZODONE HCL 50 MG PO TABS: 50.0000 mg | ORAL_TABLET | Freq: Every evening | ORAL | 0 refills | Status: DC | PRN

## 2022-12-26 NOTE — Patient Instructions (Addendum)
Sierra Quail MD for GYN Avoid NSAIDS, encourage hydration

## 2022-12-27 ENCOUNTER — Encounter: Payer: Self-pay | Admitting: Adult Health

## 2023-01-10 ENCOUNTER — Other Ambulatory Visit: Payer: Self-pay

## 2023-01-10 DIAGNOSIS — Z170 Estrogen receptor positive status [ER+]: Secondary | ICD-10-CM

## 2023-01-11 ENCOUNTER — Inpatient Hospital Stay: Payer: Medicare Other | Attending: Hematology

## 2023-01-11 ENCOUNTER — Other Ambulatory Visit: Payer: Self-pay

## 2023-01-11 ENCOUNTER — Encounter: Payer: Self-pay | Admitting: Hematology

## 2023-01-11 ENCOUNTER — Inpatient Hospital Stay: Payer: Medicare Other | Admitting: Hematology

## 2023-01-11 VITALS — BP 151/69 | HR 79 | Temp 98.6°F | Resp 16 | Ht 66.0 in | Wt 153.7 lb

## 2023-01-11 DIAGNOSIS — C50512 Malignant neoplasm of lower-outer quadrant of left female breast: Secondary | ICD-10-CM | POA: Insufficient documentation

## 2023-01-11 DIAGNOSIS — Z170 Estrogen receptor positive status [ER+]: Secondary | ICD-10-CM | POA: Insufficient documentation

## 2023-01-11 DIAGNOSIS — Z79810 Long term (current) use of selective estrogen receptor modulators (SERMs): Secondary | ICD-10-CM | POA: Diagnosis not present

## 2023-01-11 LAB — CBC WITH DIFFERENTIAL (CANCER CENTER ONLY)
Abs Immature Granulocytes: 0.01 10*3/uL (ref 0.00–0.07)
Basophils Absolute: 0 10*3/uL (ref 0.0–0.1)
Basophils Relative: 1 %
Eosinophils Absolute: 0 10*3/uL (ref 0.0–0.5)
Eosinophils Relative: 0 %
HCT: 40.6 % (ref 36.0–46.0)
Hemoglobin: 14 g/dL (ref 12.0–15.0)
Immature Granulocytes: 0 %
Lymphocytes Relative: 27 %
Lymphs Abs: 1.6 10*3/uL (ref 0.7–4.0)
MCH: 33.3 pg (ref 26.0–34.0)
MCHC: 34.5 g/dL (ref 30.0–36.0)
MCV: 96.7 fL (ref 80.0–100.0)
Monocytes Absolute: 0.6 10*3/uL (ref 0.1–1.0)
Monocytes Relative: 10 %
Neutro Abs: 3.7 10*3/uL (ref 1.7–7.7)
Neutrophils Relative %: 62 %
Platelet Count: 243 10*3/uL (ref 150–400)
RBC: 4.2 MIL/uL (ref 3.87–5.11)
RDW: 12.1 % (ref 11.5–15.5)
WBC Count: 5.9 10*3/uL (ref 4.0–10.5)
nRBC: 0 % (ref 0.0–0.2)

## 2023-01-11 LAB — CMP (CANCER CENTER ONLY)
ALT: 23 U/L (ref 0–44)
AST: 29 U/L (ref 15–41)
Albumin: 4.3 g/dL (ref 3.5–5.0)
Alkaline Phosphatase: 42 U/L (ref 38–126)
Anion gap: 8 (ref 5–15)
BUN: 29 mg/dL — ABNORMAL HIGH (ref 8–23)
CO2: 26 mmol/L (ref 22–32)
Calcium: 10 mg/dL (ref 8.9–10.3)
Chloride: 107 mmol/L (ref 98–111)
Creatinine: 1.61 mg/dL — ABNORMAL HIGH (ref 0.44–1.00)
GFR, Estimated: 33 mL/min — ABNORMAL LOW (ref >60)
Glucose, Bld: 104 mg/dL — ABNORMAL HIGH (ref 70–99)
Potassium: 4.6 mmol/L (ref 3.5–5.1)
Sodium: 141 mmol/L (ref 135–145)
Total Bilirubin: 0.7 mg/dL (ref 0.3–1.2)
Total Protein: 7.4 g/dL (ref 6.5–8.1)

## 2023-01-11 NOTE — Progress Notes (Signed)
[facility]   [phone] Fax:([phone]   Clinic Follow up Note   Patient Care Team: Mahlon Gammon, MD as PCP - General (Internal Medicine) Jill Side, OD as Referring Physician Elmon Else, MD as Consulting Physician (Dermatology) Pershing Proud, RN as Oncology Nurse Navigator Donnelly Angelica, RN as Oncology Nurse Navigator Malachy Mood, MD as Consulting Physician (Hematology) Dorothy Puffer, MD as Consulting Physician (Radiation Oncology) Manus Rudd, MD as Consulting Physician (General Surgery) Pollyann Samples, NP as Nurse Practitioner (Nurse Practitioner)  Date of Service:  01/11/2023  CHIEF COMPLAINT: f/u of left breast cancer  CURRENT THERAPY:  Tamoxifen 10 mg daily 08/2022   ASSESSMENT:  Sierra Summers is a 77 y.o. female with   Malignant neoplasm of lower-outer quadrant of left breast of female, estrogen receptor positive (HCC) -pT1bN0M0 stage IA, G1, ER+/PR/HER2- -diagnosed in 05/2022 -Initial biopsy showed DCIS only -Reviewed her surgical pathology findings, which showed 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  Lymph nodes was not biopsied, which is reasonable given her very early stage and her advanced age  -Dr. Corliss Skains does not recommend more surgery  -she is not a candidate for radiation due to her previous radiation history  -she started tamoxifen in February 2024, she started at low dose 10mg  daily but did not tolerate well  -She is tolerating 10 mg better than 20 mg, however still has some hot flashes and some moody change, she would like to change to 5 mg daily.  She is also very concerned about potential side effect especially thrombosis and endometrial cancer.  We discussed low-dose 5 mg to prevent future breast cancer, but there is no evidence in adjuvant setting for invasive breast cancer.  She does not want, of tamoxifen, after lengthy discussion, I agree to let her try 5mg  daily for  a total of 5 years.     PLAN: -lab  reviewed -Tamoxifen for 5 years -mammogram and bone density schedule 04/2023 - will decrease Tamoxifen to 5 mg due to poor tolerance  Lab and f/u 6 months with NP Lacie.   SUMMARY OF ONCOLOGIC HISTORY: Oncology History Overview Note   Cancer Staging  Malignant neoplasm of lower-outer quadrant of left breast of female, estrogen receptor positive (HCC) Staging form: Breast, AJCC 8th Edition - Clinical stage from 05/30/2022: Stage 0 (cTis (DCIS), cN0, cM0, G2, ER+, PR+, HER2: Not Assessed) - Signed by Malachy Mood, MD on 06/08/2022 Stage prefix: Initial diagnosis Histologic grading system: 3 grade system - Pathologic stage from 06/24/2022: Stage Unknown (pT1b, pNX, cM0, G1, ER+, PR+, HER2-) - Signed by Malachy Mood, MD on 07/12/2022 Stage prefix: Initial diagnosis Nuclear grade: G1 Histologic grading system: 3 grade system Residual tumor (R): R1 - Microscopic     Malignant neoplasm of lower-outer quadrant of left breast of female, estrogen receptor positive (HCC)  05/30/2022 Cancer Staging   Staging form: Breast, AJCC 8th Edition - Clinical stage from 05/30/2022: Stage 0 (cTis (DCIS), cN0, cM0, G2, ER+, PR+, HER2: Not Assessed) - Signed by Malachy Mood, MD on 06/08/2022 Stage prefix: Initial diagnosis Histologic grading system: 3 grade system   06/03/2022 Initial Diagnosis   Ductal carcinoma in situ (DCIS) of left breast   06/24/2022 Cancer Staging   Staging form: Breast, AJCC 8th Edition - Pathologic stage from 06/24/2022: Stage Unknown (pT1b, pNX, cM0, G1, ER+, PR+, HER2-) - Signed by Malachy Mood, MD on 07/12/2022 Stage prefix: Initial diagnosis Nuclear grade: G1 Histologic grading  system: 3 grade system Residual tumor (R): R1 - Microscopic   06/24/2022 Definitive Surgery   Procedure:  Left radioactive seed localized lumpectomy Surgeon:  TSUEI,MATTHEW K.   08/04/2022 -  Anti-estrogen oral therapy   Adjuvant Tamoxifen    10/12/2022 Survivorship   SCP  given by Santiago Glad, NP      INTERVAL HISTORY:  Sierra Summers is here for a follow up of left breast cancer. She was last seen by NP Lacie on 10/12/2022. She presents to the clinic alone. Pt state that she feels a lot better since she has decrease from 20 mg to 10 mg of the Tamoxifen. Pt state she once to decrease to 5 mg of Tamoxifen.   All other systems were reviewed with the patient and are negative.  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   Hyperlipidemia    Hypertension    Non Hodgkin's lymphoma (HCC) 1989   Osteopenia    Phlebitis    Sciatica of left side     SURGICAL HISTORY: Past Surgical History:  Procedure Laterality Date   BONE MARROW TRANSPLANT  1991   BREAST BIOPSY Left 05/30/2022   MM LT BREAST BX W LOC DEV 1ST LESION IMAGE BX SPEC STEREO GUIDE 05/30/2022 [facility]   BREAST BIOPSY  06/23/2022   MM LT RADIOACTIVE SEED LOC MAMMO GUIDE 06/23/2022 [facility]   BREAST CYST EXCISION Right    BREAST LUMPECTOMY WITH RADIOACTIVE SEED LOCALIZATION Left 06/24/2022   Procedure: LEFT BREAST LUMPECTOMY WITH RADIOACTIVE SEED LOCALIZATION;  Surgeon: Manus Rudd, MD;  Location: [facility] OR;  Service: General;  Laterality: Left;  LMA   Laprascopy  1989 and 1992    I have reviewed the social history and family history with the patient and they are unchanged from previous note.  ALLERGIES:  is allergic to sulfa antibiotics.  MEDICATIONS:  Current Outpatient Medications  Medication Sig Dispense Refill   aspirin EC 81 MG tablet Take 81 mg by mouth daily.     Black Pepper-Turmeric (TURMERIC CURCUMIN) 11-998 MG CAPS Take 1 capsule by mouth daily.     Coenzyme Q10  (CO Q 10) 100 MG CAPS Take 100 mg by mouth daily.     losartan (COZAAR) 50 MG tablet Take 1 tablet (50 mg total) by mouth daily. 90 tablet 3   LUTEIN PO Take 1 tablet by mouth daily.     Multiple Vitamin (MULTIVITAMIN) tablet Take 1 tablet by mouth daily.     Omega-3 Fatty Acids (OMEGA 3 PO) Take 1,280 mg by mouth daily.     OVER THE COUNTER MEDICATION Take 1 capsule by mouth daily. Cordyceps     OVER THE COUNTER MEDICATION Take 750 mg by mouth daily. Bacopa     simvastatin (ZOCOR) 40 MG tablet TAKE 0.5 TABLETS (20 MG TOTAL) BY MOUTH DAILY AT 6 PM. 45 tablet 0   tamoxifen (NOLVADEX) 10 MG tablet TAKE 1 TABLET BY MOUTH EVERY DAY 90 tablet 1   traZODone (DESYREL) 50 MG tablet Take 1 tablet (50 mg total) by mouth at bedtime as needed. 30 tablet 0   No current facility-administered  medications for this visit.    PHYSICAL EXAMINATION: ECOG PERFORMANCE STATUS: 0 - Asymptomatic  Vitals:   01/11/23 1146  BP: (!) 151/69  Pulse: 79  Resp: 16  Temp: 98.6 F (37 C)  SpO2: 98%   Wt Readings from Last 3 Encounters:  01/11/23 153 lb 11.2 oz (69.7 kg)  12/26/22 158 lb 6.4 oz (71.8 kg)  10/12/22 157 lb 6.4 oz (71.4 kg)     GENERAL:alert, no distress and comfortable SKIN: skin color normal, no rashes or significant lesions EYES: normal, Conjunctiva are pink and non-injected, sclera clear  NEURO: alert & oriented x 3 with fluent speech ABDOMEN:(-)abdomen soft, (-) non-tender and normal bowel sounds BREAST: RT breast no palpable mass, LT breast lumpectomy no palpable mass breast exam Nipple dark in color. LABORATORY DATA:  I have reviewed the data as listed    Latest Ref Rng & Units 01/11/2023   11:15 AM 12/20/2022   12:00 AM 06/08/2022   12:08 PM  CBC  WBC 4.0 - 10.5 K/uL 5.9  4.9     5.9   Hemoglobin 12.0 - 15.0 g/dL 16.1  09.6     04.5   Hematocrit 36.0 - 46.0 % 40.6  44     38.5   Platelets 150 - 400 K/uL 243  271     235      This result is from an external source.        Latest  Ref Rng & Units 01/11/2023   11:15 AM 12/20/2022   12:00 AM 06/08/2022   12:08 PM  CMP  Glucose 70 - 99 mg/dL 409   811   BUN 8 - 23 mg/dL 29  23     22    Creatinine 0.44 - 1.00 mg/dL 9.14  1.4     7.82   Sodium 135 - 145 mmol/L 141  141     140   Potassium 3.5 - 5.1 mmol/L 4.6  4.6     4.4   Chloride 98 - 111 mmol/L 107  107     108   CO2 22 - 32 mmol/L 26  20     25    Calcium 8.9 - 10.3 mg/dL 95.6  9.2     9.7   Total Protein 6.5 - 8.1 g/dL 7.4   7.2   Total Bilirubin 0.3 - 1.2 mg/dL 0.7   0.7   Alkaline Phos 38 - 126 U/L 42  47     56   AST 15 - 41 U/L 29  31     29    ALT 0 - 44 U/L 23  24     23       This result is from an external source.      RADIOGRAPHIC STUDIES: I have personally reviewed the radiological images as listed and agreed with the findings in the report. No results found.    No orders of the defined types were placed in this encounter.  All questions were answered. The patient knows to call the clinic with any problems, questions or concerns. No barriers to learning was detected. The total time spent in the appointment was 30 minutes.     Malachy Mood, MD 01/11/2023   Carolin Coy, CMA, am acting as scribe for Malachy Mood, MD.   I have reviewed the above documentation for accuracy and completeness, and I agree with the above.

## 2023-01-11 NOTE — Assessment & Plan Note (Signed)
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  Lymph nodes was not biopsied, which is reasonable given her very early stage and her advanced age  -Dr. Corliss Skains does not recommend more surgery  -she is not a candidate for radiation due to her previous radiation history  -she started tamoxifen in February 2024, she started at low dose 10mg  daily but did not tolerate well

## 2023-01-16 ENCOUNTER — Other Ambulatory Visit: Payer: Self-pay | Admitting: Internal Medicine

## 2023-01-16 DIAGNOSIS — E785 Hyperlipidemia, unspecified: Secondary | ICD-10-CM

## 2023-01-24 ENCOUNTER — Other Ambulatory Visit: Payer: Self-pay | Admitting: Internal Medicine

## 2023-01-24 DIAGNOSIS — E785 Hyperlipidemia, unspecified: Secondary | ICD-10-CM

## 2023-03-08 ENCOUNTER — Other Ambulatory Visit: Payer: Self-pay | Admitting: Hematology

## 2023-03-08 ENCOUNTER — Other Ambulatory Visit: Payer: Self-pay

## 2023-04-21 ENCOUNTER — Other Ambulatory Visit: Payer: Self-pay | Admitting: Internal Medicine

## 2023-04-21 DIAGNOSIS — I10 Essential (primary) hypertension: Secondary | ICD-10-CM

## 2023-04-27 ENCOUNTER — Ambulatory Visit
Admission: RE | Admit: 2023-04-27 | Discharge: 2023-04-27 | Disposition: A | Discharge status: Home / Self Care | Payer: Medicare Other | Source: Ambulatory Visit | Attending: Nurse Practitioner | Admitting: Nurse Practitioner

## 2023-04-27 DIAGNOSIS — C50512 Malignant neoplasm of lower-outer quadrant of left female breast: Secondary | ICD-10-CM

## 2023-06-27 ENCOUNTER — Encounter: Payer: Medicare Other | Admitting: Internal Medicine

## 2023-07-09 NOTE — Assessment & Plan Note (Signed)
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  Lymph nodes was not biopsied, which is reasonable given her very early stage and her advanced age  -Dr. Belinda does not recommend more surgery  -she is not a candidate for radiation due to her previous radiation history  -she started tamoxifen  in February 2024, she started at low dose 10mg  daily but did not tolerate well.  She is now taking tamoxifen  5 mg daily.  Started 01/2023.

## 2023-07-09 NOTE — Progress Notes (Signed)
 Patient Care Team: Charlanne Fredia CROME, MD as PCP - General (Internal Medicine) Elspeth Lauraine DEL, OD as Referring Physician Robinson Pao, MD as Consulting Physician (Dermatology) Glean Stephane BROCKS, RN as Oncology Nurse Navigator Tyree Nanetta SAILOR, RN as Oncology Nurse Navigator Lanny Callander, MD as Consulting Physician (Hematology) Dewey Rush, MD as Consulting Physician (Radiation Oncology) Belinda Cough, MD as Consulting Physician (General Surgery) Burton, Lacie K, NP as Nurse Practitioner (Nurse Practitioner)  Clinic Day:  07/10/2023  Referring physician: Charlanne Fredia CROME, MD  ASSESSMENT & PLAN:   Assessment & Plan: Malignant neoplasm of lower-outer quadrant of left breast of female, estrogen receptor positive (HCC) -pT1bN0M0 stage IA, G1, ER+/PR/HER2- -diagnosed in 05/2022 -Initial biopsy showed DCIS only -Reviewed her surgical pathology findings, which showed 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  Lymph nodes was not biopsied, which is reasonable given her very early stage and her advanced age  -Dr. Belinda does not recommend more surgery  -she is not a candidate for radiation due to her previous radiation history  -she started tamoxifen  in February 2024, she started at low dose 10mg  daily but did not tolerate well.  She is now taking tamoxifen  5 mg daily.  Started 01/2023.   Plan: Labs reviewed  -CBC showing WBC 5.5; Hgb 14.2; Hct 40.8; Plt 249; Anc 3.0 -CMP - K 4.6; glucose 114; BUN 20; Creatinine 1.38; eGFR 39; Ca 10.0; LFTs normal.   -reviewed recent mammogram from 04/27/2023 which was benign --repeat diagnostic mammogram recommended in 1 year.  -reviewed DEXA scan from 04/27/2023 which indicates osteopenia without osteoporosis. She is taking calcium and vitamin D daily. She is very active. Repeat DEXA scan in 2 years  Continue tamoxifen  5 mg daily  Clinically, she is doing well from cancer perspective. Labs with follow up in 6 months.   The patient  understands the plans discussed today and is in agreement with them.  She knows to contact our office if she develops concerns prior to her next appointment.  I provided 25 minutes of face-to-face time during this encounter and > 50% was spent counseling as documented under my assessment and plan.    [name], NP  [facility] - A DEPT OF [facility] [address] [place] [address] Dept: [phone] Dept Fax: [phone]   No orders of the defined types were placed in this encounter.     CHIEF COMPLAINT:  CC: Follow-up of left breast cancer  Current Treatment: Tamoxifen  10 mg daily started 08/2022. Now taking tamoxifen  5 mg daily   INTERVAL HISTORY:  Sierra Summers is here today for repeat clinical assessment.  She last saw Dr. Lanny on 01/11/2023.  She had a bone density test done 04/27/2023.  She is considered to be osteopenic/low bone mass.  The 10-year probability of a hip fracture is 3%, or a 10-year probability of major osteoporotic fracture is 20%.  She also had diagnostic bilateral mammogram on 04/27/2023.  The results were benign. The patient states that she stays very active. Walks for four miles,6/7 day per week. Continues to play pickle ball 2 times per week. 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 She denies chest pain, chest pressure, or shortness of breath. She denies headaches or visual disturbances. She denies abdominal pain, nausea, vomiting, or changes in bowel or bladder habits.  She denies fevers or chills. She  denies pain. Her appetite is good. Her weight has increased 5 pounds over last 6 months .  I have reviewed the past medical history, past surgical history, social history and family history with the patient and they are unchanged from previous note.  ALLERGIES:  is allergic to sulfa antibiotics.  MEDICATIONS:  Current Outpatient  Medications  Medication Sig Dispense Refill   aspirin EC 81 MG tablet Take 81 mg by mouth daily.     Black Pepper-Turmeric (TURMERIC CURCUMIN) 11-998 MG CAPS Take 1 capsule by mouth daily.     Coenzyme Q10 (CO Q 10) 100 MG CAPS Take 100 mg by mouth daily.     losartan  (COZAAR ) 50 MG tablet TAKE 1 TABLET BY MOUTH EVERY DAY 90 tablet 3   LUTEIN PO Take 1 tablet by mouth daily.     Multiple Vitamin (MULTIVITAMIN) tablet Take 1 tablet by mouth daily.     Omega-3 Fatty Acids (OMEGA 3 PO) Take 1,280 mg by mouth daily.     OVER THE COUNTER MEDICATION Take 1 capsule by mouth daily. Cordyceps     OVER THE COUNTER MEDICATION Take 750 mg by mouth daily. Bacopa     simvastatin  (ZOCOR ) 40 MG tablet TAKE 0.5 TABLETS BY MOUTH DAILY AT 6 PM. 45 tablet 1   tamoxifen  (NOLVADEX ) 10 MG tablet TAKE 1 TABLET BY MOUTH EVERY DAY (Patient taking differently: Take 10 mg by mouth daily. Patient cutting pills in half and taking 5 mg.) 90 tablet 1   traZODone  (DESYREL ) 50 MG tablet Take 1 tablet (50 mg total) by mouth at bedtime as needed. 30 tablet 0   No current facility-administered medications for this visit.    HISTORY OF PRESENT ILLNESS:   Oncology History Overview Note   Cancer Staging  Malignant neoplasm of lower-outer quadrant of left breast of female, estrogen receptor positive (HCC) Staging form: Breast, AJCC 8th Edition - Clinical stage from 05/30/2022: Stage 0 (cTis (DCIS), cN0, cM0, G2, ER+, PR+, HER2: Not Assessed) - Signed by Lanny Callander, MD on 06/08/2022 Stage prefix: Initial diagnosis Histologic grading system: 3 grade system - Pathologic stage from 06/24/2022: Stage Unknown (pT1b, pNX, cM0, G1, ER+, PR+, HER2-) - Signed by Lanny Callander, MD on 07/12/2022 Stage prefix: Initial diagnosis Nuclear grade: G1 Histologic grading system: 3 grade system Residual tumor (R): R1 - Microscopic     Malignant neoplasm of lower-outer quadrant of left breast of female, estrogen receptor positive (HCC)  05/30/2022  Cancer Staging   Staging form: Breast, AJCC 8th Edition - Clinical stage from 05/30/2022: Stage 0 (cTis (DCIS), cN0, cM0, G2, ER+, PR+, HER2: Not Assessed) - Signed by Lanny Callander, MD on 06/08/2022 Stage prefix: Initial diagnosis Histologic grading system: 3 grade system   06/03/2022 Initial Diagnosis   Ductal carcinoma in situ (DCIS) of left breast   06/24/2022 Cancer Staging   Staging form: Breast, AJCC 8th Edition - Pathologic stage from 06/24/2022: Stage Unknown (pT1b, pNX, cM0, G1, ER+, PR+, HER2-) - Signed by Lanny Callander, MD on 07/12/2022 Stage prefix: Initial diagnosis Nuclear grade: G1 Histologic grading system: 3 grade system Residual tumor (R): R1 - Microscopic   06/24/2022 Definitive Surgery   Procedure:  Left radioactive seed localized lumpectomy Surgeon:  TSUEI,MATTHEW K.   08/04/2022 -  Anti-estrogen oral therapy   Adjuvant Tamoxifen     10/12/2022 Survivorship   SCP given by Lacie Burton, NP   04/27/2023 Mammogram   3D diagnostic mammogram, bilateral IMPRESSION: No evidence of breast malignancy.  RECOMMENDATION: Bilateral diagnostic mammogram  in 1 year.  BI-RADS CATEGORY  2: Benign.   04/27/2023 Imaging   DEXA scan ASSESSMENT: The BMD measured at Forearm Radius 33% is 0.764 g/cm2 with a T-score of -1.4. This patient is considered osteopenic/low bone mass according to World Health Organization [facility]) criteria.  The quality of the exam is good. The lumbar spine was excluded due to degenerative changes.   Site Region Measured Date Measured Age YA BMD Significant CHANGE T-score  Right Forearm Radius 33% 4/27/2023 77.6 -1.4 0.764 g/cm2 Right Forearm Radius 33% 03/30/2020 74.5 -1.5 0.757 g/cm2   DualFemur Neck Right 4/27/2023 77.6 0.3 1.081 g/cm2 DualFemur Neck Right 03/30/2020 74.5 0.6 1.120 g/cm2   DualFemur Total Mean 04/27/2023 77.6 1.3 1.171 g/cm2 * DualFemur Total Mean 03/30/2020 74.5 1.6 1.211 g/cm2   World Health Organization [facility]) criteria for  post-menopausal, Caucasian Women: Normal       T-score at or above -1 SD Osteopenia   T-score between -1 and -2.5 SD Osteoporosis T-score at or below -2.5 SD   RECOMMENDATION: 1. All patients should optimize calcium and vitamin D intake. 2. Consider FDA-approved medical therapies in postmenopausal women and men aged 69 years and older, based on the following: a. A hip or vertebral (clinical or morphometric) fracture. b. T-score = -2.5 at the femoral neck or spine after appropriate evaluation to exclude secondary causes. c. Low bone mass (T-score between -1.0 and -2.5 at the femoral neck or spine) and a 10-year probability of a hip fracture = 3% or a 10-year probability of a major osteoporosis-related fracture = 20% based on the [place] -adapted WHO algorithm. d. Clinician judgment and/or patient preferences may indicate treatment for people with 10-year fracture probabilities above or below these levels.         REVIEW OF SYSTEMS:   Constitutional: Denies fevers, chills or abnormal weight loss Eyes: Denies blurriness of vision Ears, nose, mouth, throat, and face: Denies mucositis or sore throat Respiratory: Denies cough, dyspnea or wheezes Cardiovascular: Denies palpitation, chest discomfort or lower extremity swelling Gastrointestinal:  Denies nausea, heartburn or change in bowel habits Skin: Denies abnormal skin rashes Lymphatics: Denies new lymphadenopathy or easy bruising Neurological:Denies numbness, tingling or new weaknesses Behavioral/Psych: Mood is stable, no new changes  All other systems were reviewed with the patient and are negative.   VITALS:   Today's Vitals   07/10/23 1122  BP: (!) 154/79  Pulse: 83  Resp: 18  Temp: 97.9 F (36.6 C)  TempSrc: Temporal  SpO2: 98%  Weight: 158 lb 3.2 oz (71.8 kg)  Height: 5' 6 (1.676 m)  PainSc: 0-No pain   Body mass index is 25.53 kg/m.   Wt Readings from Last 3 Encounters:  07/10/23 158 lb 3.2 oz (71.8 kg)  01/11/23 153  lb 11.2 oz (69.7 kg)  12/26/22 158 lb 6.4 oz (71.8 kg)    Body mass index is 25.53 kg/m.  Performance status (ECOG): 0 - Asymptomatic  PHYSICAL EXAM:   GENERAL:alert, no distress and comfortable SKIN: skin color, texture, turgor are normal, no rashes or significant lesions EYES: normal, Conjunctiva are pink and non-injected, sclera clear OROPHARYNX:no exudate, no erythema and lips, buccal mucosa, and tongue normal  NECK: supple, thyroid normal size, non-tender, without nodularity LYMPH:  no palpable lymphadenopathy in the cervical, axillary or inguinal LUNGS: clear to auscultation and percussion with normal breathing effort HEART: regular rate & rhythm and no murmurs and no lower extremity edema ABDOMEN:abdomen soft, non-tender and normal bowel sounds Musculoskeletal:no cyanosis of digits and no clubbing  NEURO: alert & oriented x 3 with fluent speech, no focal motor/sensory deficits BREAST: right breast is without masses or lumps today. No nipple inversion or discharge. There is no noted axillary lymphadenopathy on the right side. The left breast has small and well healed lumpectomy scars. There are no palpable masses or lumps. There is no nipple inversion or discharge. There is no palpable axillary lymphadenopathy on the left side.   LABORATORY DATA:  I have reviewed the data as listed    Component Value Date/Time   NA 139 07/10/2023 1058   NA 141 12/20/2022 0000   K 4.6 07/10/2023 1058   CL 105 07/10/2023 1058   CO2 28 07/10/2023 1058   GLUCOSE 114 (H) 07/10/2023 1058   BUN 20 07/10/2023 1058   BUN 23 (A) 12/20/2022 0000   CREATININE 1.38 (H) 07/10/2023 1058   CALCIUM 10.0 07/10/2023 1058   PROT 7.3 07/10/2023 1058   ALBUMIN 4.5 07/10/2023 1058   AST 28 07/10/2023 1058   ALT 23 07/10/2023 1058   ALKPHOS 44 07/10/2023 1058   BILITOT 0.6 07/10/2023 1058   GFRNONAA 39 (L) 07/10/2023 1058   GFRAA 45.29 11/03/2020 0300    Lab Results  Component Value Date   WBC 5.5  07/10/2023   NEUTROABS 3.0 07/10/2023   HGB 14.2 07/10/2023   HCT 40.8 07/10/2023   MCV 96.0 07/10/2023   PLT 249 07/10/2023

## 2023-07-10 ENCOUNTER — Inpatient Hospital Stay: Payer: Medicare Other | Attending: Nurse Practitioner

## 2023-07-10 ENCOUNTER — Encounter: Payer: Self-pay | Admitting: Nurse Practitioner

## 2023-07-10 ENCOUNTER — Inpatient Hospital Stay: Payer: Medicare Other | Admitting: Nurse Practitioner

## 2023-07-10 VITALS — BP 154/79 | HR 83 | Temp 97.9°F | Resp 18 | Ht 66.0 in | Wt 158.2 lb

## 2023-07-10 DIAGNOSIS — C50512 Malignant neoplasm of lower-outer quadrant of left female breast: Secondary | ICD-10-CM

## 2023-07-10 DIAGNOSIS — Z79810 Long term (current) use of selective estrogen receptor modulators (SERMs): Secondary | ICD-10-CM | POA: Insufficient documentation

## 2023-07-10 DIAGNOSIS — Z170 Estrogen receptor positive status [ER+]: Secondary | ICD-10-CM | POA: Diagnosis not present

## 2023-07-10 LAB — CBC WITH DIFFERENTIAL (CANCER CENTER ONLY)
Abs Immature Granulocytes: 0 10*3/uL (ref 0.00–0.07)
Basophils Absolute: 0 10*3/uL (ref 0.0–0.1)
Basophils Relative: 1 %
Eosinophils Absolute: 0.1 10*3/uL (ref 0.0–0.5)
Eosinophils Relative: 1 %
HCT: 40.8 % (ref 36.0–46.0)
Hemoglobin: 14.2 g/dL (ref 12.0–15.0)
Immature Granulocytes: 0 %
Lymphocytes Relative: 35 %
Lymphs Abs: 1.9 10*3/uL (ref 0.7–4.0)
MCH: 33.4 pg (ref 26.0–34.0)
MCHC: 34.8 g/dL (ref 30.0–36.0)
MCV: 96 fL (ref 80.0–100.0)
Monocytes Absolute: 0.5 10*3/uL (ref 0.1–1.0)
Monocytes Relative: 9 %
Neutro Abs: 3 10*3/uL (ref 1.7–7.7)
Neutrophils Relative %: 54 %
Platelet Count: 249 10*3/uL (ref 150–400)
RBC: 4.25 MIL/uL (ref 3.87–5.11)
RDW: 12.1 % (ref 11.5–15.5)
WBC Count: 5.5 10*3/uL (ref 4.0–10.5)
nRBC: 0 % (ref 0.0–0.2)

## 2023-07-10 LAB — CMP (CANCER CENTER ONLY)
ALT: 23 U/L (ref 0–44)
AST: 28 U/L (ref 15–41)
Albumin: 4.5 g/dL (ref 3.5–5.0)
Alkaline Phosphatase: 44 U/L (ref 38–126)
Anion gap: 6 (ref 5–15)
BUN: 20 mg/dL (ref 8–23)
CO2: 28 mmol/L (ref 22–32)
Calcium: 10 mg/dL (ref 8.9–10.3)
Chloride: 105 mmol/L (ref 98–111)
Creatinine: 1.38 mg/dL — ABNORMAL HIGH (ref 0.44–1.00)
GFR, Estimated: 39 mL/min — ABNORMAL LOW (ref >60)
Glucose, Bld: 114 mg/dL — ABNORMAL HIGH (ref 70–99)
Potassium: 4.6 mmol/L (ref 3.5–5.1)
Sodium: 139 mmol/L (ref 135–145)
Total Bilirubin: 0.6 mg/dL (ref 0.0–1.2)
Total Protein: 7.3 g/dL (ref 6.5–8.1)

## 2023-07-11 ENCOUNTER — Encounter: Payer: Self-pay | Admitting: Internal Medicine

## 2023-07-11 ENCOUNTER — Non-Acute Institutional Stay: Payer: Medicare Other | Admitting: Internal Medicine

## 2023-07-11 VITALS — BP 120/68 | HR 88 | Temp 97.8°F | Ht 66.0 in | Wt 157.2 lb

## 2023-07-11 DIAGNOSIS — F5104 Psychophysiologic insomnia: Secondary | ICD-10-CM

## 2023-07-11 DIAGNOSIS — E785 Hyperlipidemia, unspecified: Secondary | ICD-10-CM

## 2023-07-11 DIAGNOSIS — I10 Essential (primary) hypertension: Secondary | ICD-10-CM | POA: Diagnosis not present

## 2023-07-11 DIAGNOSIS — N1832 Chronic kidney disease, stage 3b: Secondary | ICD-10-CM

## 2023-07-11 DIAGNOSIS — E7800 Pure hypercholesterolemia, unspecified: Secondary | ICD-10-CM | POA: Diagnosis not present

## 2023-07-11 DIAGNOSIS — F5101 Primary insomnia: Secondary | ICD-10-CM | POA: Diagnosis not present

## 2023-07-11 MED ORDER — LOSARTAN POTASSIUM 50 MG PO TABS: 50.0000 mg | ORAL_TABLET | Freq: Every day | ORAL | 3 refills | Status: AC

## 2023-07-11 MED ORDER — TRAZODONE HCL 50 MG PO TABS: 50.0000 mg | ORAL_TABLET | Freq: Every evening | ORAL | 4 refills | Status: DC | PRN

## 2023-07-11 MED ORDER — SIMVASTATIN 40 MG PO TABS: 40.0000 mg | ORAL_TABLET | Freq: Every day | ORAL | 3 refills | Status: DC

## 2023-07-11 NOTE — Progress Notes (Signed)
 Location:  [facility] Editor of Service:  Clinic (12)  Provider:   Code Status:  Goals of Care:     12/26/2022   12:57 PM  Advanced Directives  Does Patient Have a Medical Advance Directive? Yes  Type of Estate Agent of [place];Living will;Out of facility DNR (pink MOST or yellow form)  Does patient want to make changes to medical advance directive? No - Patient declined     Chief Complaint  Patient presents with   Medical Management of Chronic Issues    Patient presents today for a 6 month follow-up    HPI: Patient is a 78 y.o. female seen today for medical management of chronic diseases.    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 The patient reports that the side effects have significantly improved with the reduced dosage.  The patient also has a history of kidney issues, likely due to the chemotherapy and radiation therapy received for non-Hodgkin's lymphoma.  She is aware of the need to avoid certain medications, such as ibuprofen. The patient's kidney function has remained stable over the years.  The patient contracted COVID-19 in August and has not yet received the COVID-19 vaccine, opting to rely on natural immunity through the winter months. The patient has not received the flu shot either.  The patient also occasionally  takes Trazodone  for sleep  The patient is active, playing pickleball regularly, and reports no falls or difficulties with mobility.  The patient's recent mammogram was clear, and the patient is pleased to have made it a year post-diagnosis. The patient is scheduled for a follow-up with their oncologist in six months.      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   Hyperlipidemia    Hypertension    Non Hodgkin's lymphoma (HCC) 1989   Osteopenia    Phlebitis    Sciatica of left side     Past Surgical History:  Procedure Laterality Date   BONE MARROW TRANSPLANT  1991   BREAST BIOPSY Left 05/30/2022   MM LT BREAST BX W LOC DEV 1ST LESION IMAGE BX SPEC STEREO GUIDE 05/30/2022 [facility]   BREAST BIOPSY  06/23/2022   MM LT RADIOACTIVE SEED LOC MAMMO GUIDE 06/23/2022 [facility]   BREAST CYST EXCISION Right    BREAST LUMPECTOMY WITH RADIOACTIVE SEED LOCALIZATION Left 06/24/2022   Procedure: LEFT BREAST LUMPECTOMY WITH RADIOACTIVE SEED LOCALIZATION;  Surgeon: Belinda Cough, MD;  Location: [facility];  Service: General;  Laterality: Left;  LMA  Laprascopy  1989 and 1992    Allergies  Allergen Reactions   Sulfa Antibiotics Rash    Outpatient Encounter Medications as of 07/11/2023  Medication Sig   aspirin EC 81 MG tablet Take 81 mg by mouth daily.   Black Pepper-Turmeric (TURMERIC CURCUMIN) 11-998 MG CAPS Take 1 capsule by mouth daily.   Coenzyme Q10 (CO Q 10) 100 MG CAPS Take 100 mg by mouth daily.   losartan  (COZAAR ) 50 MG tablet TAKE 1 TABLET BY MOUTH EVERY DAY   LUTEIN PO Take 1 tablet by mouth daily.   Multiple Vitamin (MULTIVITAMIN) tablet Take 1 tablet by mouth daily.   Omega-3 Fatty Acids  (OMEGA 3 PO) Take 1,280 mg by mouth daily.   OVER THE COUNTER MEDICATION Take 1 capsule by mouth daily. Cordyceps   OVER THE COUNTER MEDICATION Take 750 mg by mouth daily. Bacopa   simvastatin  (ZOCOR ) 40 MG tablet TAKE 0.5 TABLETS BY MOUTH DAILY AT 6 PM.   tamoxifen  (NOLVADEX ) 10 MG tablet TAKE 1 TABLET BY MOUTH EVERY DAY (Patient taking differently: Take 10 mg by mouth daily. Patient cutting pills in half and taking 5 mg.)   traZODone  (DESYREL ) 50 MG tablet Take 1 tablet (50 mg total) by mouth at bedtime as needed.   No facility-administered encounter medications on file as of 07/11/2023.    Review of Systems:  Review of Systems  Constitutional:  Negative for activity change and appetite change.  HENT: Negative.    Respiratory:  Negative for cough and shortness of breath.   Cardiovascular:  Negative for leg swelling.  Gastrointestinal:  Negative for constipation.  Genitourinary: Negative.   Musculoskeletal:  Negative for arthralgias, gait problem and myalgias.  Skin: Negative.   Neurological:  Negative for dizziness and weakness.  Psychiatric/Behavioral:  Negative for confusion, dysphoric mood and sleep disturbance.     Health Maintenance  Topic Date Due   Medicare Annual Wellness (AWV)  09/17/2021   COVID-19 Vaccine (5 - 2024-25 season) 03/05/2023   INFLUENZA VACCINE  09/01/2023 (Originally 02/02/2023)   Pneumonia Vaccine 60+ Years old  Completed   DEXA SCAN  Completed   Hepatitis C Screening  Completed   Zoster Vaccines- Shingrix  Completed   HPV VACCINES  Aged Out   DTaP/Tdap/Td  Discontinued   Fecal DNA (Cologuard)  Discontinued    Physical Exam: Vitals:   07/11/23 1301  BP: 120/68  Pulse: 88  Temp: 97.8 F (36.6 C)  SpO2: 97%  Weight: 157 lb 3.2 oz (71.3 kg)  Height: 5' 6 (1.676 m)   Body mass index is 25.37 kg/m. Physical Exam Vitals reviewed.  Constitutional:      Appearance: Normal appearance.  HENT:     Head: Normocephalic.     Nose: Nose normal.      Mouth/Throat:     Mouth: Mucous membranes are moist.     Pharynx: Oropharynx is clear.  Eyes:     Pupils: Pupils are equal, round, and reactive to light.  Cardiovascular:     Rate and Rhythm: Normal rate and regular rhythm.     Pulses: Normal pulses.     Heart sounds: Normal heart sounds. No murmur heard. Pulmonary:     Effort: Pulmonary effort is normal.     Breath sounds: Normal breath sounds.  Abdominal:     General: Abdomen is flat. Bowel sounds are normal.     Palpations: Abdomen is soft.  Musculoskeletal:        General: No swelling.  Cervical back: Neck supple.  Skin:    General: Skin is warm.  Neurological:     General: No focal deficit present.     Mental Status: She is alert and oriented to person, place, and time.  Psychiatric:        Mood and Affect: Mood normal.        Thought Content: Thought content normal.     Labs reviewed: Basic Metabolic Panel: Recent Labs    12/20/22 0000 01/11/23 1115 07/10/23 1058  NA 141 141 139  K 4.6 4.6 4.6  CL 107 107 105  CO2 20 26 28   GLUCOSE  --  104* 114*  BUN 23* 29* 20  CREATININE 1.4* 1.61* 1.38*  CALCIUM 9.2 10.0 10.0  TSH 4.63  --   --    Liver Function Tests: Recent Labs    12/20/22 0000 01/11/23 1115 07/10/23 1058  AST 31 29 28   ALT 24 23 23   ALKPHOS 47 42 44  BILITOT  --  0.7 0.6  PROT  --  7.4 7.3  ALBUMIN 4.3 4.3 4.5   No results for input(s): LIPASE, AMYLASE in the last 8760 hours. No results for input(s): AMMONIA in the last 8760 hours. CBC: Recent Labs    12/20/22 0000 01/11/23 1115 07/10/23 1058  WBC 4.9 5.9 5.5  NEUTROABS  --  3.7 3.0  HGB 14.3 14.0 14.2  HCT 44 40.6 40.8  MCV  --  96.7 96.0  PLT 271 243 249   Lipid Panel: Recent Labs    12/20/22 0000  CHOL 142  HDL 60  LDLCALC 67  TRIG 75   No results found for: HGBA1C  Procedures since last visit: No results found.  Assessment/Plan Assessment and Plan    Breast Cancer S/P Lumpectomy  on 5mg  of  Tamoxifen  daily,  Patient reports tolerating the 5mg  dose well with no significant side effects. -Continue Tamoxifen  5mg  daily.  Non-Hodgkin's Lymphoma History of Non-Hodgkin's Lymphoma treated with radiation therapy 30 years ago.  Osteopenia  Last DEXA scan showed a score of -1.4.in 10/24 .  Hypertension  -Continue Losartan  as prescribed.  Hyperlipidemia LDL 67 in 07/24 -Continue Simvastatin  20mg  daily. Insomnia Continue Trazodone  PRN  General Health Maintenance -Patient had COVID in August and has not yet received the COVID vaccine. -Patient has not received the flu shot. -Consider COVID and flu vaccination.next fall -Schedule follow-up appointment in 6 months with Christy. -Schedule blood work. If not done by Oncology        Labs/tests ordered:  * No order type specified * Next appt:  Visit date not found

## 2023-07-23 ENCOUNTER — Encounter: Payer: Self-pay | Admitting: Internal Medicine

## 2023-08-02 ENCOUNTER — Other Ambulatory Visit: Payer: Self-pay | Admitting: Internal Medicine

## 2023-08-02 DIAGNOSIS — E785 Hyperlipidemia, unspecified: Secondary | ICD-10-CM

## 2023-08-03 ENCOUNTER — Other Ambulatory Visit: Payer: Self-pay | Admitting: Internal Medicine

## 2023-08-03 DIAGNOSIS — F5104 Psychophysiologic insomnia: Secondary | ICD-10-CM

## 2024-01-08 ENCOUNTER — Encounter: Payer: Self-pay | Admitting: Adult Health

## 2024-01-12 ENCOUNTER — Other Ambulatory Visit: Payer: Self-pay

## 2024-01-12 DIAGNOSIS — D0512 Intraductal carcinoma in situ of left breast: Secondary | ICD-10-CM

## 2024-01-12 DIAGNOSIS — Z170 Estrogen receptor positive status [ER+]: Secondary | ICD-10-CM

## 2024-01-14 NOTE — Assessment & Plan Note (Signed)
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  Lymph nodes was not biopsied, which is reasonable given her very early stage and her advanced age  -Dr. Belinda does not recommend more surgery  -she is not a candidate for radiation due to her previous radiation history  -she started tamoxifen  in February 2024, she started at low dose 10mg  daily but did not tolerate well, dose reduced to 5 mg daily in July 2024.

## 2024-01-15 ENCOUNTER — Ambulatory Visit: Payer: Medicare Other | Admitting: Hematology

## 2024-01-15 ENCOUNTER — Inpatient Hospital Stay: Payer: Medicare Other | Attending: Nurse Practitioner

## 2024-01-15 VITALS — BP 120/62 | HR 57 | Temp 97.3°F | Resp 18 | Wt 155.0 lb

## 2024-01-15 DIAGNOSIS — C50512 Malignant neoplasm of lower-outer quadrant of left female breast: Secondary | ICD-10-CM

## 2024-01-15 DIAGNOSIS — Z170 Estrogen receptor positive status [ER+]: Secondary | ICD-10-CM | POA: Insufficient documentation

## 2024-01-15 DIAGNOSIS — Z1732 Human epidermal growth factor receptor 2 negative status: Secondary | ICD-10-CM | POA: Insufficient documentation

## 2024-01-15 DIAGNOSIS — D0512 Intraductal carcinoma in situ of left breast: Secondary | ICD-10-CM

## 2024-01-15 DIAGNOSIS — Z79810 Long term (current) use of selective estrogen receptor modulators (SERMs): Secondary | ICD-10-CM | POA: Insufficient documentation

## 2024-01-15 LAB — CBC WITH DIFFERENTIAL (CANCER CENTER ONLY)
Abs Immature Granulocytes: 0.01 K/uL (ref 0.00–0.07)
Basophils Absolute: 0 K/uL (ref 0.0–0.1)
Basophils Relative: 1 %
Eosinophils Absolute: 0 K/uL (ref 0.0–0.5)
Eosinophils Relative: 1 %
HCT: 38.2 % (ref 36.0–46.0)
Hemoglobin: 13.5 g/dL (ref 12.0–15.0)
Immature Granulocytes: 0 %
Lymphocytes Relative: 27 %
Lymphs Abs: 1.5 K/uL (ref 0.7–4.0)
MCH: 33.6 pg (ref 26.0–34.0)
MCHC: 35.3 g/dL (ref 30.0–36.0)
MCV: 95 fL (ref 80.0–100.0)
Monocytes Absolute: 0.5 K/uL (ref 0.1–1.0)
Monocytes Relative: 9 %
Neutro Abs: 3.4 K/uL (ref 1.7–7.7)
Neutrophils Relative %: 62 %
Platelet Count: 224 K/uL (ref 150–400)
RBC: 4.02 MIL/uL (ref 3.87–5.11)
RDW: 12.3 % (ref 11.5–15.5)
WBC Count: 5.4 K/uL (ref 4.0–10.5)
nRBC: 0 % (ref 0.0–0.2)

## 2024-01-15 LAB — CMP (CANCER CENTER ONLY)
ALT: 26 U/L (ref 0–44)
AST: 27 U/L (ref 15–41)
Albumin: 4.3 g/dL (ref 3.5–5.0)
Alkaline Phosphatase: 38 U/L (ref 38–126)
Anion gap: 6 (ref 5–15)
BUN: 37 mg/dL — ABNORMAL HIGH (ref 8–23)
CO2: 25 mmol/L (ref 22–32)
Calcium: 9.4 mg/dL (ref 8.9–10.3)
Chloride: 110 mmol/L (ref 98–111)
Creatinine: 1.7 mg/dL — ABNORMAL HIGH (ref 0.44–1.00)
GFR, Estimated: 31 mL/min — ABNORMAL LOW (ref >60)
Glucose, Bld: 98 mg/dL (ref 70–99)
Potassium: 4.5 mmol/L (ref 3.5–5.1)
Sodium: 141 mmol/L (ref 135–145)
Total Bilirubin: 0.6 mg/dL (ref 0.0–1.2)
Total Protein: 6.8 g/dL (ref 6.5–8.1)

## 2024-01-15 NOTE — Progress Notes (Signed)
 [facility]   [phone] [phone]) [phone]   Clinic Follow up Note   Patient Care Team: Charlanne Fredia CROME, MD as PCP - General (Internal Medicine) Elspeth Lauraine DEL, OD as Referring Physician Robinson Pao, MD as Consulting Physician (Dermatology) Glean Stephane BROCKS, RN (Inactive) as Oncology Nurse Navigator Tyree Nanetta SAILOR, RN as Oncology Nurse Navigator Lanny Callander, MD as Consulting Physician (Hematology) Dewey Rush, MD as Consulting Physician (Radiation Oncology) Belinda Cough, MD as Consulting Physician (General Surgery) Burton, Lacie K, NP as Nurse Practitioner (Nurse Practitioner)  Date of Service:  01/15/2024  CHIEF COMPLAINT: f/u of left breast cancer  CURRENT THERAPY:  Adjuvant tamoxifen  5 mg daily  Oncology History   Malignant neoplasm of lower-outer quadrant of left breast of female, estrogen receptor positive (HCC) -pT1bN0M0 stage IA, G1, ER+/PR/HER2- -diagnosed in 05/2022 -Initial biopsy showed DCIS only -Reviewed her surgical pathology findings, which showed 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  Lymph nodes was not biopsied, which is reasonable given her very early stage and her advanced age  -Dr. Belinda does not recommend more surgery  -she is not a candidate for radiation due to her previous radiation history  -she started tamoxifen  in February 2024, she started at low dose 10mg  daily but did not tolerate well, dose reduced to 5 mg daily in July 2024.  Assessment & Plan Breast cancer, stage 1 Stage 1 breast cancer managed with tamoxifen  5 mg daily. She experienced significant side effects with higher doses, including mental apathy and cramps, but 5 mg is well-tolerated with minimal side effects. No significant hot flashes, only mild warm sensations. Surgical incision is well-healed with minimal scar tissue. At this dose, the risk of endometrial cancer is negligible, and the risk of thromboembolism is similar to  the general population. - Continue tamoxifen  5 mg daily. - Order diagnostic mammogram in October. - Schedule follow-up in 6 months. - Coordinate lab work with Dr. Charlanne to avoid duplicate testing.  Osteopenia Osteopenia managed with monitoring. Bone density scan last performed in October 2024. No acute issues reported. - Repeat bone density scan in 2026.  Plan - Tolerating low-dose tamoxifen  well, continue - Lab and follow-up with NP in 6 months - Mammogram in late October 2024, I ordered for her   SUMMARY OF ONCOLOGIC HISTORY: Oncology History Overview Note   Cancer Staging  Malignant neoplasm of lower-outer quadrant of left breast of female, estrogen receptor positive (HCC) Staging form: Breast, AJCC 8th Edition - Clinical stage from 05/30/2022: Stage 0 (cTis (DCIS), cN0, cM0, G2, ER+, PR+, HER2: Not Assessed) - Signed by Lanny Callander, MD on 06/08/2022 Stage prefix: Initial diagnosis Histologic grading system: 3 grade system - Pathologic stage from 06/24/2022: Stage Unknown (pT1b, pNX, cM0, G1, ER+, PR+, HER2-) - Signed by Lanny Callander, MD on 07/12/2022 Stage prefix: Initial diagnosis Nuclear grade: G1 Histologic grading system: 3 grade system Residual tumor (R): R1 - Microscopic     Malignant neoplasm of lower-outer quadrant of left breast of female, estrogen receptor positive (HCC)  05/30/2022 Cancer Staging   Staging form: Breast, AJCC 8th Edition - Clinical stage from 05/30/2022: Stage 0 (cTis (DCIS), cN0, cM0, G2, ER+, PR+, HER2: Not Assessed) - Signed by Lanny Callander, MD on 06/08/2022 Stage prefix: Initial diagnosis Histologic grading system: 3 grade system   06/03/2022 Initial Diagnosis   Ductal carcinoma in situ (DCIS) of left breast   06/24/2022 Cancer Staging   Staging form: Breast, AJCC 8th Edition - Pathologic  stage from 06/24/2022: Stage Unknown (pT1b, pNX, cM0, G1, ER+, PR+, HER2-) - Signed by Lanny Callander, MD on 07/12/2022 Stage prefix: Initial diagnosis Nuclear grade:  G1 Histologic grading system: 3 grade system Residual tumor (R): R1 - Microscopic   06/24/2022 Definitive Surgery   Procedure:  Left radioactive seed localized lumpectomy Surgeon:  TSUEI,MATTHEW K.   08/04/2022 -  Anti-estrogen oral therapy   Adjuvant Tamoxifen     10/12/2022 Survivorship   SCP given by Lacie Burton, NP   04/27/2023 Mammogram   3D diagnostic mammogram, bilateral IMPRESSION: No evidence of breast malignancy.  RECOMMENDATION: Bilateral diagnostic mammogram in 1 year.  BI-RADS CATEGORY  2: Benign.   04/27/2023 Imaging   DEXA scan ASSESSMENT: The BMD measured at Forearm Radius 33% is 0.764 g/cm2 with a T-score of -1.4. This patient is considered osteopenic/low bone mass according to World Health Organization [facility]) criteria.  The quality of the exam is good. The lumbar spine was excluded due to degenerative changes.   Site Region Measured Date Measured Age YA BMD Significant CHANGE T-score  Right Forearm Radius 33% 04/27/2023 77.6 -1.4 0.764 g/cm2 Right Forearm Radius 33% 03/30/2020 74.5 -1.5 0.757 g/cm2   DualFemur Neck Right 4/27/2023 77.6 0.3 1.081 g/cm2 DualFemur Neck Right 03/30/2020 74.5 0.6 1.120 g/cm2   DualFemur Total Mean 04/27/2023 77.6 1.3 1.171 g/cm2 * DualFemur Total Mean 03/30/2020 74.5 1.6 1.211 g/cm2   World Health Organization [facility]) criteria for post-menopausal, Caucasian Women: Normal       T-score at or above -1 SD Osteopenia   T-score between -1 and -2.5 SD Osteoporosis T-score at or below -2.5 SD   RECOMMENDATION: 1. All patients should optimize calcium and vitamin D intake. 2. Consider FDA-approved medical therapies in postmenopausal women and men aged 65 years and older, based on the following: a. A hip or vertebral (clinical or morphometric) fracture. b. T-score = -2.5 at the femoral neck or spine after appropriate evaluation to exclude secondary causes. c. Low bone mass (T-score between -1.0 and -2.5 at the femoral neck or spine) and  a 10-year probability of a hip fracture = 3% or a 10-year probability of a major osteoporosis-related fracture = 20% based on the [place] -adapted WHO algorithm. d. Clinician judgment and/or patient preferences may indicate treatment for people with 10-year fracture probabilities above or below these levels.        Discussed the use of AI scribe software for clinical note transcription with the patient, who gave verbal consent to proceed.  History of Present Illness Sierra Summers is a 78 year old female with breast cancer who presents for follow-up.  She is on tamoxifen  5 mg daily, having reduced from higher doses due to significant side effects such as mental apathy and cramps. The current dose is the most tolerable. She experiences mild hot flashes but no new pain, pelvic pain, or significant hot flashes. No breast pain is reported.  She remains physically active, walking four miles a day, six days a week, and playing pickleball twice a week. She is cautious about avoiding injuries.  She had a mammogram in December of the previous year and is due for another in October. She has a history of osteopenia and had a bone density scan in October 2024.     All other systems were reviewed with the patient and are negative.  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   Hyperlipidemia    Hypertension    Non Hodgkin's lymphoma (HCC) 1989   Osteopenia    Phlebitis    Sciatica of left side     SURGICAL HISTORY: Past Surgical History:  Procedure Laterality Date   BONE MARROW TRANSPLANT  1991   BREAST BIOPSY Left 05/30/2022   MM LT BREAST BX W LOC DEV 1ST LESION IMAGE BX SPEC STEREO GUIDE  05/30/2022 [facility]   BREAST BIOPSY  06/23/2022   MM LT RADIOACTIVE SEED LOC MAMMO GUIDE 06/23/2022 [facility]   BREAST CYST EXCISION Right    BREAST LUMPECTOMY WITH RADIOACTIVE SEED LOCALIZATION Left 06/24/2022   Procedure: LEFT BREAST LUMPECTOMY WITH RADIOACTIVE SEED LOCALIZATION;  Surgeon: Belinda Cough, MD;  Location: [facility] OR;  Service: General;  Laterality: Left;  LMA   Laprascopy  1989 and 1992    I have reviewed the social history and family history with the patient and they are unchanged from previous note.  ALLERGIES:  is allergic to sulfa antibiotics.  MEDICATIONS:  Current Outpatient Medications  Medication Sig Dispense Refill   aspirin EC 81 MG tablet Take 81 mg by mouth daily.     Black Pepper-Turmeric (TURMERIC CURCUMIN) 11-998 MG CAPS Take 1 capsule by mouth daily.     Coenzyme Q10 (CO Q 10) 100 MG CAPS Take 100 mg by mouth daily.     losartan  (COZAAR ) 50 MG tablet Take 1 tablet (50 mg total) by mouth daily. 90 tablet 3   LUTEIN PO Take 1 tablet by mouth daily.     Multiple Vitamin (MULTIVITAMIN) tablet Take 1 tablet by mouth daily.     Omega-3 Fatty Acids (OMEGA 3 PO) Take 1,280 mg by mouth daily.     OVER THE COUNTER MEDICATION Take 1 capsule by mouth daily. Cordyceps     OVER THE COUNTER MEDICATION Take 750 mg by mouth daily. Bacopa     simvastatin  (ZOCOR ) 40 MG tablet TAKE 0.5 TABLETS BY MOUTH DAILY AT 6 PM. 45 tablet 1   tamoxifen  (NOLVADEX ) 10 MG tablet TAKE 1 TABLET BY MOUTH EVERY DAY (Patient taking differently: Take 10 mg by mouth daily. Patient cutting pills in half and taking 5 mg.) 90 tablet 1   traZODone  (DESYREL ) 50 MG tablet Take 1 tablet (50 mg total) by mouth at bedtime as needed. 30 tablet 4   No current facility-administered medications for this visit.    PHYSICAL EXAMINATION: ECOG PERFORMANCE STATUS: 0 - Asymptomatic  Vitals:   01/15/24 1114  BP: 120/62  Pulse: (!) 57  Resp: 18  Temp: (!) 97.3 F (36.3 C)  SpO2: 98%   Wt  Readings from Last 3 Encounters:  01/15/24 70.3 kg (155 lb)  07/11/23 71.3 kg (157 lb 3.2 oz)  07/10/23 71.8 kg (158 lb 3.2 oz)     GENERAL:alert, no distress and comfortable SKIN: skin color, texture, turgor are normal, no rashes or significant lesions EYES: normal, Conjunctiva are pink and non-injected, sclera clear NECK: supple, thyroid normal size, non-tender, without nodularity LYMPH:  no palpable lymphadenopathy in the cervical, axillary  LUNGS: clear to auscultation and percussion with normal breathing effort HEART: regular rate & rhythm and no murmurs and no lower extremity edema ABDOMEN:abdomen soft, non-tender and normal bowel  sounds Musculoskeletal:no cyanosis of digits and no clubbing  NEURO: alert & oriented x 3 with fluent speech, no focal motor/sensory deficits BREAST: Breast incision healed well with minimal scar tissue, no swelling.  No palpable breast mass or adenopathy. Physical Exam   LABORATORY DATA:  I have reviewed the data as listed    Latest Ref Rng & Units 01/15/2024   11:00 AM 07/10/2023   10:58 AM 01/11/2023   11:15 AM  CBC  WBC 4.0 - 10.5 K/uL 5.4  5.5  5.9   Hemoglobin 12.0 - 15.0 g/dL 86.4  85.7  85.9   Hematocrit 36.0 - 46.0 % 38.2  40.8  40.6   Platelets 150 - 400 K/uL 224  249  243         Latest Ref Rng & Units 07/10/2023   10:58 AM 01/11/2023   11:15 AM 12/20/2022   12:00 AM  CMP  Glucose 70 - 99 mg/dL 885  895    BUN 8 - 23 mg/dL 20  29  23       Creatinine 0.44 - 1.00 mg/dL 8.61  8.38  1.4      Sodium 135 - 145 mmol/L 139  141  141      Potassium 3.5 - 5.1 mmol/L 4.6  4.6  4.6      Chloride 98 - 111 mmol/L 105  107  107      CO2 22 - 32 mmol/L 28  26  20       Calcium 8.9 - 10.3 mg/dL 89.9  89.9  9.2      Total Protein 6.5 - 8.1 g/dL 7.3  7.4    Total Bilirubin 0.0 - 1.2 mg/dL 0.6  0.7    Alkaline Phos 38 - 126 U/L 44  42  47      AST 15 - 41 U/L 28  29  31       ALT 0 - 44 U/L 23  23  24          This result is from an external source.       RADIOGRAPHIC STUDIES: I have personally reviewed the radiological images as listed and agreed with the findings in the report. No results found.    Orders Placed This Encounter  Procedures   MM 3D DIAGNOSTIC MAMMOGRAM BILATERAL BREAST    Standing Status:   Future    Expected Date:   05/01/2024    Expiration Date:   01/14/2025    Reason for Exam (SYMPTOM  OR DIAGNOSIS REQUIRED):   screening    Preferred imaging location?:   [facility]   All questions were answered. The patient knows to call the clinic with any problems, questions or concerns. No barriers to learning was detected. The total time spent in the appointment was 25 minutes, including review of chart and various tests results, discussions about plan of care and coordination of care plan     Onita Mattock, MD 01/15/2024

## 2024-01-16 ENCOUNTER — Encounter: Payer: Self-pay | Admitting: Internal Medicine

## 2024-01-16 ENCOUNTER — Non-Acute Institutional Stay: Payer: Self-pay | Admitting: Internal Medicine

## 2024-01-16 VITALS — BP 114/64 | HR 70 | Temp 97.6°F | Ht 66.0 in | Wt 158.4 lb

## 2024-01-16 DIAGNOSIS — E7800 Pure hypercholesterolemia, unspecified: Secondary | ICD-10-CM | POA: Diagnosis not present

## 2024-01-16 DIAGNOSIS — N1832 Chronic kidney disease, stage 3b: Secondary | ICD-10-CM

## 2024-01-16 DIAGNOSIS — F5101 Primary insomnia: Secondary | ICD-10-CM | POA: Diagnosis not present

## 2024-01-16 DIAGNOSIS — I10 Essential (primary) hypertension: Secondary | ICD-10-CM

## 2024-01-16 NOTE — Patient Instructions (Addendum)
 I am ordering some Labs that you can get with [facility] [place]

## 2024-01-17 NOTE — Progress Notes (Signed)
 Location:   [facility]    Place of Service:  Clinic   Provider:   Code Status:  Goals of Care:     12/26/2022   12:57 PM  Advanced Directives  Does Patient Have a Medical Advance Directive? Yes  Type of Estate agent of [place];Living will;Out of facility DNR (pink MOST or yellow form)  Does patient want to make changes to medical advance directive? No - Patient declined     Chief Complaint  Patient presents with   Medical Management of Chronic Issues    6 month follow up. Needs AWV and covid vaccine. She saw her lab results on Mychart but wants to go over them with you.    HPI: Patient is a 78 y.o. female seen today for medical management of chronic diseases.    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 Due to side effects on higher dose  CKD Due to previous chemotherapy and radiation therapy received for non-Hodgkin's lymphoma   The patient is active, playing pickleball regularly, and reports no falls or difficulties with mobility.  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   Hyperlipidemia    Hypertension    Non Hodgkin's lymphoma (HCC) 1989   Osteopenia    Phlebitis    Sciatica of left side     Past Surgical History:  Procedure Laterality Date   BONE MARROW TRANSPLANT  1991   BREAST BIOPSY Left 05/30/2022   MM LT BREAST BX W LOC DEV 1ST LESION  IMAGE BX SPEC STEREO GUIDE 05/30/2022 [facility]   BREAST BIOPSY  06/23/2022   MM LT RADIOACTIVE SEED LOC MAMMO GUIDE 06/23/2022 [facility]   BREAST CYST EXCISION Right    BREAST LUMPECTOMY WITH RADIOACTIVE SEED LOCALIZATION Left 06/24/2022   Procedure: LEFT BREAST LUMPECTOMY WITH RADIOACTIVE SEED LOCALIZATION;  Surgeon: Belinda Cough, MD;  Location: [facility];  Service: General;  Laterality: Left;  LMA   Laprascopy  1989 and 1992    Allergies  Allergen Reactions   Sulfa Antibiotics Rash    Outpatient Encounter Medications as of 01/16/2024  Medication Sig   aspirin EC 81 MG tablet Take 81 mg by mouth daily.   Black Pepper-Turmeric (TURMERIC CURCUMIN) 11-998 MG CAPS Take 1 capsule by mouth daily.   Coenzyme Q10 (CO Q 10) 100 MG CAPS Take 100 mg by mouth daily.   losartan  (COZAAR ) 50 MG tablet Take 1 tablet (50 mg total) by mouth daily.   LUTEIN PO Take 1 tablet by mouth daily.   Multiple Vitamin (MULTIVITAMIN) tablet Take 1 tablet by mouth daily.   Omega-3 Fatty Acids (OMEGA 3 PO) Take 1,280 mg by mouth daily.   OVER THE COUNTER MEDICATION Take 1 capsule by mouth daily. Cordyceps   OVER THE COUNTER MEDICATION Take  750 mg by mouth daily. Bacopa   simvastatin  (ZOCOR ) 40 MG tablet TAKE 0.5 TABLETS BY MOUTH DAILY AT 6 PM.   tamoxifen  (NOLVADEX ) 10 MG tablet TAKE 1 TABLET BY MOUTH EVERY DAY (Patient taking differently: Take 5 mg by mouth daily.)   traZODone  (DESYREL ) 50 MG tablet Take 1 tablet (50 mg total) by mouth at bedtime as needed.   UNABLE TO FIND Med Name: [facility]   UNABLE TO FIND Med Name: [facility]   No facility-administered encounter medications on file as of 01/16/2024.    Review of Systems:  Review of Systems  Constitutional:  Negative for activity change and appetite change.  HENT: Negative.    Respiratory:  Negative for cough and shortness of breath.   Cardiovascular:  Negative for leg swelling.  Gastrointestinal:  Negative for constipation.   Genitourinary: Negative.   Musculoskeletal:  Negative for arthralgias, gait problem and myalgias.  Skin: Negative.   Neurological:  Negative for dizziness and weakness.  Psychiatric/Behavioral:  Negative for confusion, dysphoric mood and sleep disturbance.     Health Maintenance  Topic Date Due   Medicare Annual Wellness (AWV)  09/17/2021   COVID-19 Vaccine (5 - 2024-25 season) 03/05/2023   INFLUENZA VACCINE  02/02/2024   Pneumococcal Vaccine: 50+ Years  Completed   DEXA SCAN  Completed   Hepatitis C Screening  Completed   Zoster Vaccines- Shingrix  Completed   Hepatitis B Vaccines  Aged Out   HPV VACCINES  Aged Out   Meningococcal B Vaccine  Aged Out   DTaP/Tdap/Td  Discontinued   Fecal DNA (Cologuard)  Discontinued    Physical Exam: Vitals:   01/16/24 1305  BP: 114/64  Pulse: 70  Temp: 97.6 F (36.4 C)  SpO2: 99%  Weight: 158 lb 6.4 oz (71.8 kg)  Height: 5' 6 (1.676 m)   Body mass index is 25.57 kg/m. Physical Exam Vitals reviewed.  Constitutional:      Appearance: Normal appearance.  HENT:     Head: Normocephalic.     Right Ear: Tympanic membrane normal.     Left Ear: Tympanic membrane normal.     Nose: Nose normal.     Mouth/Throat:     Mouth: Mucous membranes are moist.     Pharynx: Oropharynx is clear.  Eyes:     Pupils: Pupils are equal, round, and reactive to light.  Cardiovascular:     Rate and Rhythm: Normal rate and regular rhythm.     Pulses: Normal pulses.     Heart sounds: Normal heart sounds. No murmur heard. Pulmonary:     Effort: Pulmonary effort is normal.     Breath sounds: Normal breath sounds.  Abdominal:     General: Abdomen is flat. Bowel sounds are normal.     Palpations: Abdomen is soft.  Musculoskeletal:        General: No swelling.     Cervical back: Neck supple.  Skin:    General: Skin is warm.  Neurological:     General: No focal deficit present.     Mental Status: She is alert and oriented to person, place, and time.   Psychiatric:        Mood and Affect: Mood normal.        Thought Content: Thought content normal.     Labs reviewed: Basic Metabolic Panel: Recent Labs    07/10/23 1058 01/15/24 1100  NA 139 141  K 4.6 4.5  CL 105 110  CO2 28 25  GLUCOSE 114*  98  BUN 20 37*  CREATININE 1.38* 1.70*  CALCIUM 10.0 9.4   Liver Function Tests: Recent Labs    07/10/23 1058 01/15/24 1100  AST 28 27  ALT 23 26  ALKPHOS 44 38  BILITOT 0.6 0.6  PROT 7.3 6.8  ALBUMIN 4.5 4.3   No results for input(s): LIPASE, AMYLASE in the last 8760 hours. No results for input(s): AMMONIA in the last 8760 hours. CBC: Recent Labs    07/10/23 1058 01/15/24 1100  WBC 5.5 5.4  NEUTROABS 3.0 3.4  HGB 14.2 13.5  HCT 40.8 38.2  MCV 96.0 95.0  PLT 249 224   Lipid Panel: No results for input(s): CHOL, HDL, LDLCALC, TRIG, CHOLHDL, LDLDIRECT in the last 8760 hours. No results found for: HGBA1C  Procedures since last visit: No results found.  Assessment/Plan 1. Essential hypertension (Primary) [place] - Lipid Panel  2. Pure hypercholesterolemia Zocor  - TSH - Vitamin D, 25-hydroxy - Lipid Panel  3. Stage 3b chronic kidney disease (HCC) Some worsening Will Follow  No Renal Referral Yet  4. Primary insomnia Does not take Trazodone  anymore Leave it PRN for now - Lipid Panel 5 s/p Lumpectomy On Low dose of Tamoxifen    Labs/tests ordered:  * No order type specified * Next appt:  Visit date not found

## 2024-04-29 ENCOUNTER — Ambulatory Visit
Admission: RE | Admit: 2024-04-29 | Discharge: 2024-04-29 | Disposition: A | Source: Ambulatory Visit | Attending: Hematology

## 2024-04-29 DIAGNOSIS — Z170 Estrogen receptor positive status [ER+]: Secondary | ICD-10-CM

## 2024-05-31 ENCOUNTER — Other Ambulatory Visit: Payer: Self-pay | Admitting: Hematology

## 2024-06-05 ENCOUNTER — Other Ambulatory Visit: Payer: Self-pay | Admitting: Internal Medicine

## 2024-06-05 DIAGNOSIS — E785 Hyperlipidemia, unspecified: Secondary | ICD-10-CM

## 2024-07-09 ENCOUNTER — Encounter: Payer: Self-pay | Admitting: Nurse Practitioner

## 2024-07-09 ENCOUNTER — Telehealth: Payer: Self-pay

## 2024-07-09 NOTE — Telephone Encounter (Signed)
 It appears that the lab work for [employer] and the cancer center are different labs.    Dr.Gupta please advise

## 2024-07-09 NOTE — Telephone Encounter (Signed)
 Copied from CRM [id number]. Topic: Clinical - Medical Advice >> Jul 09, 2024  3:59 PM Marda MATSU wrote: Pt Sierra Summers is asking if she has blood work drawn with [facility], on the 21st will Dr Charlanne be able to use it (results) if I see her on the 27th?  Please advise

## 2024-07-10 ENCOUNTER — Telehealth: Payer: Self-pay | Admitting: Nurse Practitioner

## 2024-07-11 ENCOUNTER — Telehealth: Payer: Self-pay

## 2024-07-11 NOTE — Telephone Encounter (Signed)
 Orders routed electronically with the notification that we are on EPIC as well (meaning provider can see order in the system, if needed)    Refer to chart review- Misc Reports- Chart review:Routing History for documentation

## 2024-07-11 NOTE — Telephone Encounter (Signed)
 Copied from CRM #[id number]. Topic: Clinical - Request for Lab/Test Order >> Jul 11, 2024 11:10 AM Diannia DEL wrote: Reason for CRM: Patient is calling in regards to the message below:  Good morning,   Dr. Demetra office received your message and it has been forwarded to Dr. Lanny and her Team.  Dr. Lanny or someone from Dr. Demetra Team will respond to your message within the next 24 to 48 business hours.   Please have Dr Charlanne fax the lab orders he wants drawn to our office.  Our fax number is [phone] attention Dr Onita Lanny.    Thanks, Anders HERO, RN   Patient states the labs are TSH for pur hypercholesterolemia, stage 3 B chronic kidney diseases, Vitamin D 25-hydroxy pur  hypercholesterolemia, lipid panel, these are in addition to the other labs she is needing done. Dr. Demetra fax number is [phone] ATTN: Dr. Onita Lanny

## 2024-07-22 ENCOUNTER — Other Ambulatory Visit

## 2024-07-22 ENCOUNTER — Other Ambulatory Visit: Payer: Self-pay | Admitting: Nurse Practitioner

## 2024-07-22 ENCOUNTER — Ambulatory Visit: Admitting: Nurse Practitioner

## 2024-07-22 DIAGNOSIS — Z170 Estrogen receptor positive status [ER+]: Secondary | ICD-10-CM

## 2024-07-22 NOTE — Progress Notes (Unsigned)
 "     [facility]   [phone] [phone]    Patient Care Team: Charlanne Fredia CROME, MD as PCP - General (Internal Medicine) Elspeth Lauraine DEL, OD as Referring Physician Robinson Pao, MD as Consulting Physician (Dermatology) Tyree Nanetta SAILOR, RN as Oncology Nurse Navigator Lanny Callander, MD as Consulting Physician (Hematology) Dewey Rush, MD as Consulting Physician (Radiation Oncology) Belinda Cough, MD as Consulting Physician (General Surgery) Ann Mayme POUR, NP as Nurse Practitioner (Nurse Practitioner)   CHIEF COMPLAINT: Follow up left breast cancer   Oncology History Overview Note   Cancer Staging  Malignant neoplasm of lower-outer quadrant of left breast of female, estrogen receptor positive (HCC) Staging form: Breast, AJCC 8th Edition - Clinical stage from 05/30/2022: Stage 0 (cTis (DCIS), cN0, cM0, G2, ER+, PR+, HER2: Not Assessed) - Signed by Lanny Callander, MD on 06/08/2022 Stage prefix: Initial diagnosis Histologic grading system: 3 grade system - Pathologic stage from 06/24/2022: Stage Unknown (pT1b, pNX, cM0, G1, ER+, PR+, HER2-) - Signed by Lanny Callander, MD on 07/12/2022 Stage prefix: Initial diagnosis Nuclear grade: G1 Histologic grading system: 3 grade system Residual tumor (R): R1 - Microscopic     Malignant neoplasm of lower-outer quadrant of left breast of female, estrogen receptor positive (HCC)  05/30/2022 Cancer Staging   Staging form: Breast, AJCC 8th Edition - Clinical stage from 05/30/2022: Stage 0 (cTis (DCIS), cN0, cM0, G2, ER+, PR+, HER2: Not Assessed) - Signed by Lanny Callander, MD on 06/08/2022 Stage prefix: Initial diagnosis Histologic grading system: 3 grade system   06/03/2022 Initial Diagnosis   Ductal carcinoma in situ (DCIS) of left breast   06/24/2022 Cancer Staging   Staging form: Breast, AJCC 8th Edition - Pathologic stage from 06/24/2022: Stage Unknown (pT1b, pNX, cM0, G1, ER+, PR+, HER2-) - Signed by Lanny Callander, MD on  07/12/2022 Stage prefix: Initial diagnosis Nuclear grade: G1 Histologic grading system: 3 grade system Residual tumor (R): R1 - Microscopic   06/24/2022 Definitive Surgery   Procedure:  Left radioactive seed localized lumpectomy Surgeon:  TSUEI,MATTHEW K.   08/04/2022 -  Anti-estrogen oral therapy   Adjuvant Tamoxifen     10/12/2022 Survivorship   SCP given by Adelard Sanon, NP   04/27/2023 Mammogram   3D diagnostic mammogram, bilateral IMPRESSION: No evidence of breast malignancy.  RECOMMENDATION: Bilateral diagnostic mammogram in 1 year.  BI-RADS CATEGORY  2: Benign.   04/27/2023 Imaging   DEXA scan ASSESSMENT: The BMD measured at Forearm Radius 33% is 0.764 g/cm2 with a T-score of -1.4. This patient is considered osteopenic/low bone mass according to World Health Organization [facility]) criteria.  The quality of the exam is good. The lumbar spine was excluded due to degenerative changes.   Site Region Measured Date Measured Age YA BMD Significant CHANGE T-score  Right Forearm Radius 33% 04/27/2023 77.6 -1.4 0.764 g/cm2 Right Forearm Radius 33% 03/30/2020 74.5 -1.5 0.757 g/cm2   DualFemur Neck Right 4/27/2023 77.6 0.3 1.081 g/cm2 DualFemur Neck Right 03/30/2020 74.5 0.6 1.120 g/cm2   DualFemur Total Mean 04/27/2023 77.6 1.3 1.171 g/cm2 * DualFemur Total Mean 03/30/2020 74.5 1.6 1.211 g/cm2   World Health Organization [facility]) criteria for post-menopausal, Caucasian Women: Normal       T-score at or above -1 SD Osteopenia   T-score between -1 and -2.5 SD Osteoporosis T-score at or below -2.5 SD   RECOMMENDATION: 1. All patients should optimize calcium and vitamin D  intake. 2. Consider FDA-approved medical therapies in postmenopausal women and men aged 17 years and  older, based on the following: a. A hip or vertebral (clinical or morphometric) fracture. b. T-score = -2.5 at the femoral neck or spine after appropriate evaluation to exclude secondary causes. c. Low bone mass  (T-score between -1.0 and -2.5 at the femoral neck or spine) and a 10-year probability of a hip fracture = 3% or a 10-year probability of a major osteoporosis-related fracture = 20% based on the [place] -adapted WHO algorithm. d. Clinician judgment and/or patient preferences may indicate treatment for people with 10-year fracture probabilities above or below these levels.        CURRENT THERAPY: Tamoxifen , starting 08/04/22 - began low dose 10 mg first but did not tolerate, currently on 5 mg daily    INTERVAL HISTORY Ms. Runco returns for follow up as scheduled. Last seen by Dr. Lanny 01/2024.  Doing very well overall with no significant changes in her health.  Tolerating low-dose tamoxifen  without any side effects.  Denies breast concerns or changes such as new lump/mass, nipple discharge or inversion, or skin change.  Sees MD at her facility every 6 months.  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   Hyperlipidemia    Hypertension    Non Hodgkin's lymphoma (HCC) 1989   Osteopenia    Phlebitis    Sciatica of left side      Past Surgical History:  Procedure Laterality Date   BONE MARROW TRANSPLANT  1991   BREAST BIOPSY Left 05/30/2022   MM LT BREAST BX W LOC DEV 1ST LESION IMAGE BX SPEC STEREO GUIDE 05/30/2022 [facility]   BREAST BIOPSY  06/23/2022   MM LT RADIOACTIVE SEED LOC MAMMO GUIDE 06/23/2022 [facility]   BREAST CYST EXCISION Right    BREAST LUMPECTOMY WITH RADIOACTIVE SEED LOCALIZATION Left 06/24/2022   Procedure: LEFT BREAST LUMPECTOMY WITH RADIOACTIVE SEED LOCALIZATION;  Surgeon: Belinda Cough, MD;  Location: [facility] OR;  Service: General;   Laterality: Left;  LMA   Laprascopy  1989 and 1992     Outpatient Encounter Medications as of 07/24/2024  Medication Sig Note   aspirin EC 81 MG tablet Take 81 mg by mouth daily. 06/15/2022: Holding for procedure   Black Pepper-Turmeric (TURMERIC CURCUMIN) 11-998 MG CAPS Take 1 capsule by mouth daily.    Coenzyme Q10 (CO Q 10) 100 MG CAPS Take 100 mg by mouth daily.    losartan  (COZAAR ) 50 MG tablet Take 1 tablet (50 mg total) by mouth daily.    LUTEIN PO Take 1 tablet by mouth daily.    Multiple Vitamin (MULTIVITAMIN) tablet Take 1 tablet by mouth daily.    Omega-3 Fatty Acids (OMEGA 3 PO) Take 1,280 mg by mouth daily.    OVER THE COUNTER MEDICATION Take 1 capsule by mouth daily. Cordyceps    OVER THE COUNTER MEDICATION Take 750 mg by mouth daily. Bacopa    simvastatin  (ZOCOR ) 40 MG tablet TAKE 1 TABLET BY MOUTH DAILY AT 6 PM.    UNABLE TO FIND Med Name: [facility]    UNABLE TO FIND Med Name: [facility]    [DISCONTINUED] tamoxifen  (NOLVADEX ) 10 MG  tablet TAKE 1 TABLET BY MOUTH EVERY DAY (Patient taking differently: Take 10 mg by mouth daily. 5 mg daily)    [DISCONTINUED] tamoxifen  (NOLVADEX ) 10 MG tablet Take 10 mg by mouth daily.    tamoxifen  (NOLVADEX ) 10 MG tablet Take 0.5 tablets (5 mg total) by mouth daily.    [DISCONTINUED] traZODone  (DESYREL ) 50 MG tablet Take 1 tablet (50 mg total) by mouth at bedtime as needed. (Patient not taking: Reported on 07/24/2024)    No facility-administered encounter medications on file as of 07/24/2024.     Today's Vitals   07/24/24 1100 07/24/24 1137  BP:  (!) 142/66  Pulse:  80  Resp:  16  Temp:  99 F (37.2 C)  Weight:  153 lb 12.8 oz (69.8 kg)  Height:  5' 6 (1.676 m)  PainSc: 0-No pain    Body mass index is 24.82 kg/m.   ECOG PERFORMANCE STATUS: 0 - Asymptomatic  PHYSICAL EXAM GENERAL:alert, no distress and comfortable SKIN: no rash  EYES: sclera clear NECK: without mass LYMPH:  no palpable cervical or supraclavicular lymphadenopathy   LUNGS: clear with normal breathing effort HEART: regular rate & rhythm NEURO: alert & oriented x 3 with fluent speech, no focal motor/sensory deficits Breast exam: No palpable mass or nodularity in either breast or axilla that I could appreciate.   CBC    Latest Ref Rng & Units 07/24/2024   11:10 AM 01/15/2024   11:00 AM 07/10/2023   10:58 AM  CBC  WBC 4.0 - 10.5 K/uL 5.0  5.4  5.5   Hemoglobin 12.0 - 15.0 g/dL 86.1  86.4  85.7   Hematocrit 36.0 - 46.0 % 38.9  38.2  40.8   Platelets 150 - 400 K/uL 241  224  249       CMP     Latest Ref Rng & Units 07/24/2024   11:10 AM 01/15/2024   11:00 AM 07/10/2023   10:58 AM  CMP  Glucose 70 - 99 mg/dL 884  98  885   BUN 8 - 23 mg/dL 26  37  20   Creatinine 0.44 - 1.00 mg/dL 8.50  8.29  8.61   Sodium 135 - 145 mmol/L 141  141  139   Potassium 3.5 - 5.1 mmol/L 4.6  4.5  4.6   Chloride 98 - 111 mmol/L 105  110  105   CO2 22 - 32 mmol/L 23  25  28    Calcium 8.9 - 10.3 mg/dL 9.6  9.4  89.9   Total Protein 6.5 - 8.1 g/dL 7.3  6.8  7.3   Total Bilirubin 0.0 - 1.2 mg/dL 0.6  0.6  0.6   Alkaline Phos 38 - 126 U/L 44  38  44   AST 15 - 41 U/L 40  27  28   ALT 0 - 44 U/L 28  26  23        ASSESSMENT & PLAN:Ivyanna Ilamae Geng is a 79 y.o. 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  Lymph nodes was not biopsied, which is reasonable given her very early stage and her advanced age  -Dr. Belinda does not recommend more surgery  -she is not a candidate for radiation due to her previous radiation history  -Began Tamoxifen  07/2022, tolerating  low-dose 5 mg without significant side effects - Ms. Iversen is clinically doing well, tolerating low dose tamoxifen  without significant SEs.  Exam is benign, labs are unremarkable. Renal function has improved.  - Recent mammogram is benign, overall no clinical concern for recurrence   PLAN: -Recent mammogram and today's labs reviewed -Continue breast cancer surveillance and low-dose tamoxifen  5 mg daily, refilled -Follow-up in 1 year per patient request, or sooner if needed - she knows what to monitor -Continue routine MD follow-up at [facility]   Orders Placed This Encounter  Procedures   MM DIAG BREAST TOMO BILATERAL    Standing Status:   Future    Expected Date:   04/30/2025    Expiration Date:   07/29/2025    Reason for Exam (SYMPTOM  OR DIAGNOSIS REQUIRED):   h/o left breast cancer    Preferred imaging location?:   [facility]      All questions were answered. The patient knows to call the clinic with any problems, questions or concerns. No barriers to learning were detected.   Kindal Ponti K Patty Leitzke, NP 07/24/2024  "

## 2024-07-23 ENCOUNTER — Encounter: Payer: Self-pay | Admitting: Internal Medicine

## 2024-07-24 ENCOUNTER — Inpatient Hospital Stay: Admitting: Nurse Practitioner

## 2024-07-24 ENCOUNTER — Ambulatory Visit: Payer: Self-pay | Admitting: Nurse Practitioner

## 2024-07-24 ENCOUNTER — Inpatient Hospital Stay: Attending: Nurse Practitioner

## 2024-07-24 ENCOUNTER — Encounter: Payer: Self-pay | Admitting: Nurse Practitioner

## 2024-07-24 VITALS — BP 142/66 | HR 80 | Temp 99.0°F | Resp 16 | Ht 66.0 in | Wt 153.8 lb

## 2024-07-24 DIAGNOSIS — C50512 Malignant neoplasm of lower-outer quadrant of left female breast: Secondary | ICD-10-CM

## 2024-07-24 DIAGNOSIS — Z7982 Long term (current) use of aspirin: Secondary | ICD-10-CM | POA: Diagnosis not present

## 2024-07-24 DIAGNOSIS — N183 Chronic kidney disease, stage 3 unspecified: Secondary | ICD-10-CM | POA: Diagnosis not present

## 2024-07-24 DIAGNOSIS — I129 Hypertensive chronic kidney disease with stage 1 through stage 4 chronic kidney disease, or unspecified chronic kidney disease: Secondary | ICD-10-CM | POA: Diagnosis not present

## 2024-07-24 DIAGNOSIS — Z170 Estrogen receptor positive status [ER+]: Secondary | ICD-10-CM | POA: Diagnosis not present

## 2024-07-24 DIAGNOSIS — Z79810 Long term (current) use of selective estrogen receptor modulators (SERMs): Secondary | ICD-10-CM | POA: Diagnosis not present

## 2024-07-24 DIAGNOSIS — M810 Age-related osteoporosis without current pathological fracture: Secondary | ICD-10-CM | POA: Diagnosis not present

## 2024-07-24 DIAGNOSIS — D0512 Intraductal carcinoma in situ of left breast: Secondary | ICD-10-CM

## 2024-07-24 LAB — CBC WITH DIFFERENTIAL (CANCER CENTER ONLY)
Abs Immature Granulocytes: 0.01 K/uL (ref 0.00–0.07)
Basophils Absolute: 0 K/uL (ref 0.0–0.1)
Basophils Relative: 1 %
Eosinophils Absolute: 0.1 K/uL (ref 0.0–0.5)
Eosinophils Relative: 1 %
HCT: 38.9 % (ref 36.0–46.0)
Hemoglobin: 13.8 g/dL (ref 12.0–15.0)
Immature Granulocytes: 0 %
Lymphocytes Relative: 27 %
Lymphs Abs: 1.3 K/uL (ref 0.7–4.0)
MCH: 33.7 pg (ref 26.0–34.0)
MCHC: 35.5 g/dL (ref 30.0–36.0)
MCV: 95.1 fL (ref 80.0–100.0)
Monocytes Absolute: 0.5 K/uL (ref 0.1–1.0)
Monocytes Relative: 9 %
Neutro Abs: 3.1 K/uL (ref 1.7–7.7)
Neutrophils Relative %: 62 %
Platelet Count: 241 K/uL (ref 150–400)
RBC: 4.09 MIL/uL (ref 3.87–5.11)
RDW: 12.6 % (ref 11.5–15.5)
WBC Count: 5 K/uL (ref 4.0–10.5)
nRBC: 0 % (ref 0.0–0.2)

## 2024-07-24 LAB — CMP (CANCER CENTER ONLY)
ALT: 28 U/L (ref 0–44)
AST: 40 U/L (ref 15–41)
Albumin: 4.6 g/dL (ref 3.5–5.0)
Alkaline Phosphatase: 44 U/L (ref 38–126)
Anion gap: 13 (ref 5–15)
BUN: 26 mg/dL — ABNORMAL HIGH (ref 8–23)
CO2: 23 mmol/L (ref 22–32)
Calcium: 9.6 mg/dL (ref 8.9–10.3)
Chloride: 105 mmol/L (ref 98–111)
Creatinine: 1.49 mg/dL — ABNORMAL HIGH (ref 0.44–1.00)
GFR, Estimated: 36 mL/min — ABNORMAL LOW
Glucose, Bld: 115 mg/dL — ABNORMAL HIGH (ref 70–99)
Potassium: 4.6 mmol/L (ref 3.5–5.1)
Sodium: 141 mmol/L (ref 135–145)
Total Bilirubin: 0.6 mg/dL (ref 0.0–1.2)
Total Protein: 7.3 g/dL (ref 6.5–8.1)

## 2024-07-24 LAB — VITAMIN D 25 HYDROXY (VIT D DEFICIENCY, FRACTURES): Vit D, 25-Hydroxy: 34.1 ng/mL (ref 30–100)

## 2024-07-24 LAB — LIPID PANEL
Cholesterol: 152 mg/dL (ref 0–200)
HDL: 64 mg/dL
LDL Cholesterol: 72 mg/dL (ref 0–99)
Total CHOL/HDL Ratio: 2.4 ratio
Triglycerides: 80 mg/dL
VLDL: 16 mg/dL (ref 0–40)

## 2024-07-24 LAB — TSH: TSH: 5.87 u[IU]/mL — ABNORMAL HIGH (ref 0.350–4.500)

## 2024-07-24 MED ORDER — TAMOXIFEN CITRATE 10 MG PO TABS: 5.0000 mg | ORAL_TABLET | Freq: Every day | ORAL | 1 refills | Status: AC

## 2024-07-25 NOTE — Telephone Encounter (Signed)
-----   Message from Lacie Burton, NP sent at 07/24/2024 12:25 PM EST ----- Please fax all of today's lab results to PCP at her facility, Dr. Charlanne  Thanks Lacie

## 2024-07-30 ENCOUNTER — Encounter: Payer: Self-pay | Admitting: Internal Medicine

## 2024-07-30 ENCOUNTER — Non-Acute Institutional Stay: Admitting: Internal Medicine

## 2024-07-30 VITALS — BP 134/68 | HR 80 | Temp 97.3°F | Ht 66.0 in | Wt 156.0 lb

## 2024-07-30 DIAGNOSIS — R7989 Other specified abnormal findings of blood chemistry: Secondary | ICD-10-CM | POA: Diagnosis not present

## 2024-07-30 DIAGNOSIS — N1832 Chronic kidney disease, stage 3b: Secondary | ICD-10-CM

## 2024-07-30 DIAGNOSIS — I10 Essential (primary) hypertension: Secondary | ICD-10-CM | POA: Diagnosis not present

## 2024-07-30 DIAGNOSIS — E785 Hyperlipidemia, unspecified: Secondary | ICD-10-CM

## 2024-07-30 MED ORDER — SIMVASTATIN 40 MG PO TABS: 20.0000 mg | ORAL_TABLET | Freq: Every day | ORAL | Status: AC

## 2024-07-30 NOTE — Progress Notes (Signed)
 "  Location:  Medical Illustrator of Service:  Clinic (12)  Provider:   Code Status:  Goals of Care:     07/24/2024   11:36 AM  Advanced Directives  Does Patient Have a Medical Advance Directive? Yes  Type of Estate Agent of [place];Living will     Chief Complaint  Patient presents with   Follow-up    6 month follow up    HPI: Patient is a 79 y.o. female seen today for medical management of chronic diseases.    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   Hyperlipidemia    Hypertension    Non Hodgkin's lymphoma (HCC) 1989   Osteopenia    Phlebitis    Sciatica of left side     Past Surgical History:  Procedure Laterality Date   BONE MARROW TRANSPLANT  1991   BREAST BIOPSY Left 05/30/2022   MM LT BREAST BX W LOC DEV 1ST LESION IMAGE BX SPEC STEREO GUIDE 05/30/2022 [facility]   BREAST BIOPSY  06/23/2022   MM LT RADIOACTIVE SEED LOC MAMMO GUIDE 06/23/2022 [facility]   BREAST CYST EXCISION Right    BREAST LUMPECTOMY WITH RADIOACTIVE SEED LOCALIZATION Left 06/24/2022   Procedure:  LEFT BREAST LUMPECTOMY WITH RADIOACTIVE SEED LOCALIZATION;  Surgeon: Belinda Cough, MD;  Location: [facility] OR;  Service: General;  Laterality: Left;  LMA   Laprascopy  1989 and 1992    Allergies[1]  Outpatient Encounter Medications as of 07/30/2024  Medication Sig   aspirin EC 81 MG tablet Take 81 mg by mouth daily.   Black Pepper-Turmeric (TURMERIC CURCUMIN) 11-998 MG CAPS Take 1 capsule by mouth daily.   Coenzyme Q10 (CO Q 10) 100 MG CAPS Take 100 mg by mouth daily.   losartan  (COZAAR ) 50 MG tablet Take 1 tablet (50 mg total) by mouth daily.   LUTEIN PO Take 1 tablet by mouth daily.   Multiple Vitamin (MULTIVITAMIN) tablet Take 1 tablet by mouth daily.   Omega-3 Fatty Acids (OMEGA 3 PO) Take 1,280 mg by mouth daily.   OVER THE COUNTER MEDICATION Take 1 capsule by mouth daily. Cordyceps   OVER THE COUNTER MEDICATION Take 750 mg by mouth daily. Bacopa   tamoxifen  (NOLVADEX ) 10 MG tablet Take 0.5 tablets (5 mg total) by mouth daily.   UNABLE TO FIND Med Name: [facility]   UNABLE TO FIND Med Name: [facility]   [DISCONTINUED] simvastatin  (ZOCOR ) 40 MG tablet TAKE 1 TABLET BY MOUTH DAILY AT 6 PM.  simvastatin  (ZOCOR ) 40 MG tablet Take 0.5 tablets (20 mg total) by mouth daily.   No facility-administered encounter medications on file as of 07/30/2024.    Review of Systems:  Review of Systems  Constitutional:  Negative for activity change and appetite change.  HENT: Negative.    Respiratory:  Negative for cough and shortness of breath.   Cardiovascular:  Negative for leg swelling.  Gastrointestinal:  Negative for constipation.  Genitourinary: Negative.   Musculoskeletal:  Negative for arthralgias, gait problem and myalgias.  Skin: Negative.   Neurological:  Negative for dizziness and weakness.  Psychiatric/Behavioral:  Negative for confusion, dysphoric mood and sleep disturbance.     Health Maintenance  Topic Date Due   Medicare Annual Wellness (AWV)  09/17/2021   COVID-19 Vaccine (5 -  2025-26 season) 08/15/2024 (Originally 03/04/2024)   Influenza Vaccine  10/01/2024 (Originally 02/02/2024)   Mammogram  04/29/2025   Pneumococcal Vaccine: 50+ Years  Completed   Bone Density Scan  Completed   Hepatitis C Screening  Completed   Zoster Vaccines- Shingrix  Completed   Meningococcal B Vaccine  Aged Out   DTaP/Tdap/Td  Discontinued   Fecal DNA (Cologuard)  Discontinued    Physical Exam: Vitals:   07/30/24 1032  BP: 134/68  Pulse: 80  Temp: (!) 97.3 F (36.3 C)  SpO2: 100%  Weight: 156 lb (70.8 kg)  Height: 5' 6 (1.676 m)   Body mass index is 25.18 kg/m. Physical Exam Vitals reviewed.  Constitutional:      Appearance: Normal appearance.  HENT:     Head: Normocephalic.     Right Ear: Tympanic membrane normal.     Left Ear: Tympanic membrane normal.     Nose: Nose normal.     Mouth/Throat:     Mouth: Mucous membranes are moist.     Pharynx: Oropharynx is clear.  Eyes:     Pupils: Pupils are equal, round, and reactive to light.  Cardiovascular:     Rate and Rhythm: Normal rate and regular rhythm.     Pulses: Normal pulses.     Heart sounds: Normal heart sounds. No murmur heard. Pulmonary:     Effort: Pulmonary effort is normal.     Breath sounds: Normal breath sounds.  Abdominal:     General: Abdomen is flat. Bowel sounds are normal.     Palpations: Abdomen is soft.  Musculoskeletal:        General: No swelling.     Cervical back: Neck supple.  Skin:    General: Skin is warm.  Neurological:     General: No focal deficit present.     Mental Status: She is alert and oriented to person, place, and time.  Psychiatric:        Mood and Affect: Mood normal.        Thought Content: Thought content normal.     Labs reviewed: Basic Metabolic Panel: Recent Labs    01/15/24 1100 07/24/24 1110 07/24/24 1111  NA 141 141  --   K 4.5 4.6  --   CL 110 105  --   CO2 25 23  --   GLUCOSE 98 115*  --   BUN 37* 26*  --   CREATININE 1.70* 1.49*  --   CALCIUM  9.4 9.6  --   TSH  --   --  5.870*   Liver Function Tests: Recent Labs    01/15/24 1100 07/24/24 1110  AST 27 40  ALT 26 28  ALKPHOS 38  44  BILITOT 0.6 0.6  PROT 6.8 7.3  ALBUMIN 4.3 4.6   No results for input(s): LIPASE, AMYLASE in the last 8760 hours. No results for input(s): AMMONIA in the last 8760 hours. CBC: Recent Labs    01/15/24 1100 07/24/24 1110  WBC 5.4 5.0  NEUTROABS 3.4 3.1  HGB 13.5 13.8  HCT 38.2 38.9  MCV 95.0 95.1  PLT 224 241   Lipid Panel: Recent Labs    07/24/24 1110  CHOL 152  HDL 64  LDLCALC 72  TRIG 80  CHOLHDL 2.4   No results found for: HGBA1C  Procedures since last visit: No results found.  Assessment/Plan 1. Hyperlipidemia, unspecified hyperlipidemia type  - simvastatin  (ZOCOR ) 40 MG tablet; Take 0.5 tablets (20 mg total) by mouth daily.  2. Essential hypertension (Primary) On losartan   3. Stage 3b chronic kidney disease (HCC) Creatinine stays stable  4. Elevated TSH Repeat TSH with T3 and T4  s/p Lumpectomy On Low dose of Tamoxifen   DEXA and Mammogram due in 2026  Labs/tests ordered:  Labs ordered Next appt:  Visit date not found        [1]  Allergies Allergen Reactions   Sulfa Antibiotics Rash   "

## 2025-01-28 ENCOUNTER — Encounter: Admitting: Internal Medicine

## 2025-07-29 ENCOUNTER — Inpatient Hospital Stay: Admitting: Hematology

## 2025-07-29 ENCOUNTER — Inpatient Hospital Stay
